# Patient Record
Sex: Female | Born: 1960 | Race: White | Hispanic: No | Marital: Married
Health system: Midwestern US, Academic
[De-identification: ages and names within clinical notes are randomized; demographics above are authoritative.]

---

## 2015-12-31 IMAGING — MG MM MAMMO DIGITAL SCREEN
6 series · 6 of 6 positions shown · non-contrast
Comparison: none

EXAM:  BILATERAL DIGITAL SCREENING MAMMOGRAM, 12/31/2015
REASON FOR PROCEDURE:  55-year-old outpatient female presents for an annual screening mammogram.  The patient has no family history of breast or ovarian cancer.  Patient has no personal history of cancers of any kind or breast surgery.  Patient has an extensive history of estrogen use, including hormonal birth control.  

R2 Computer-aided detection images were reviewed in the course of this evaluation.

[R CC]
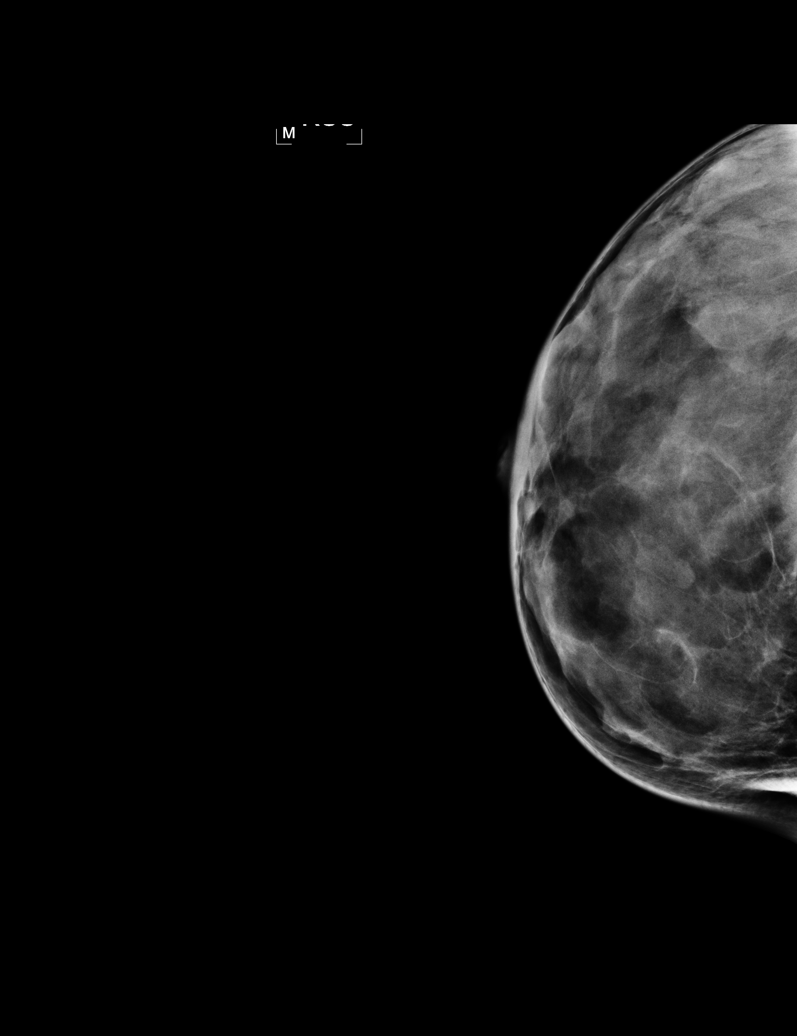

[L CC]
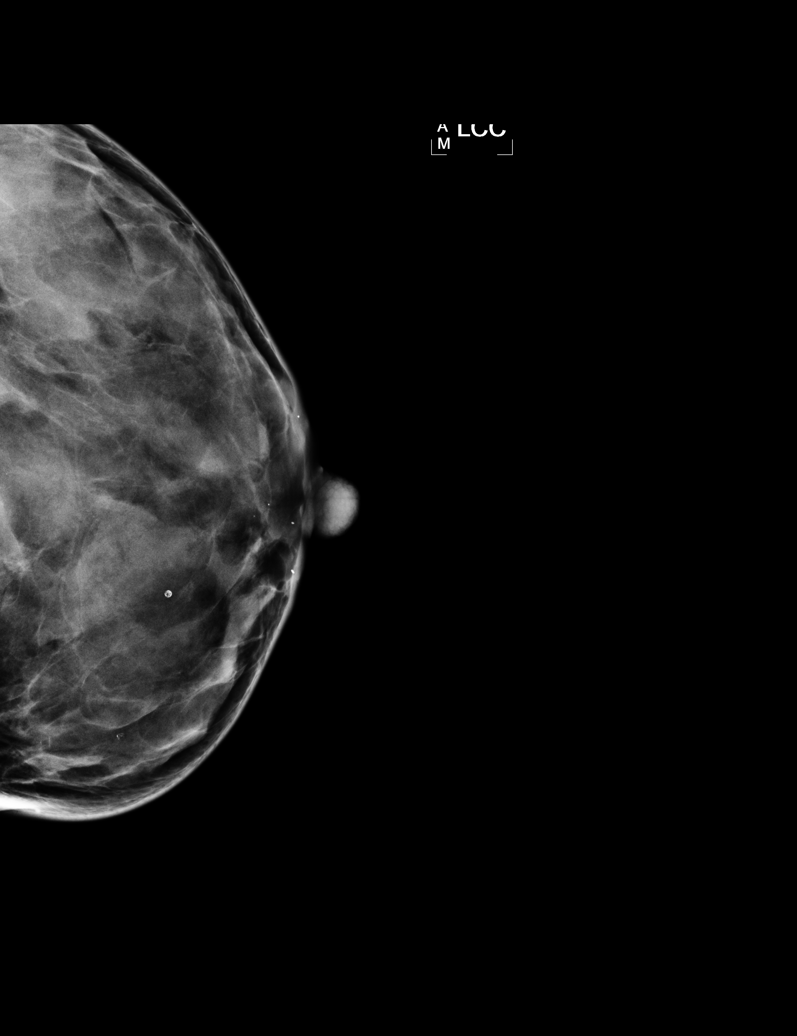

[L MLO (1 of 2)]
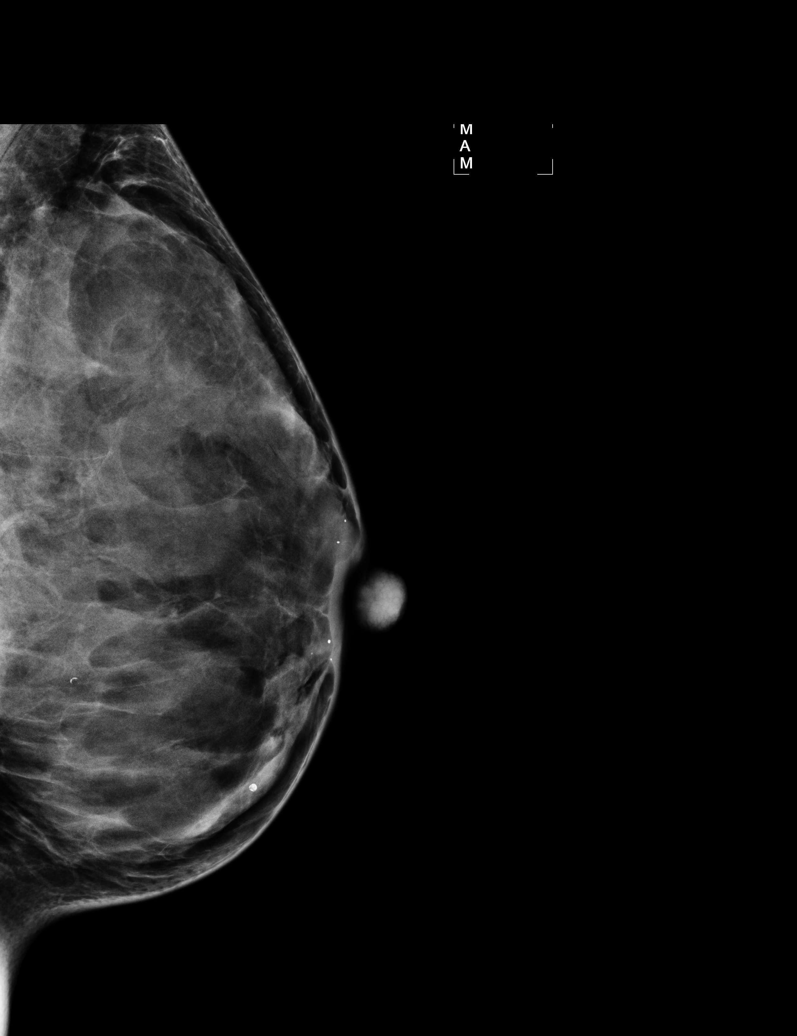

[R MLO (1 of 2)]
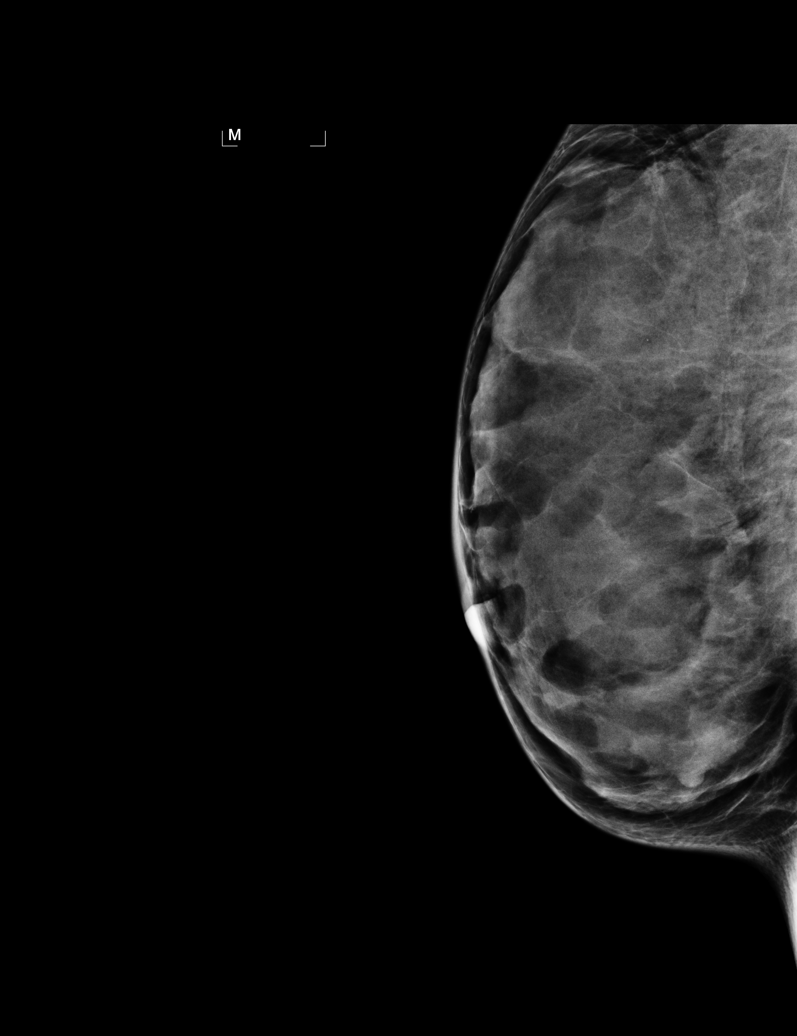

[L MLO (2 of 2)]
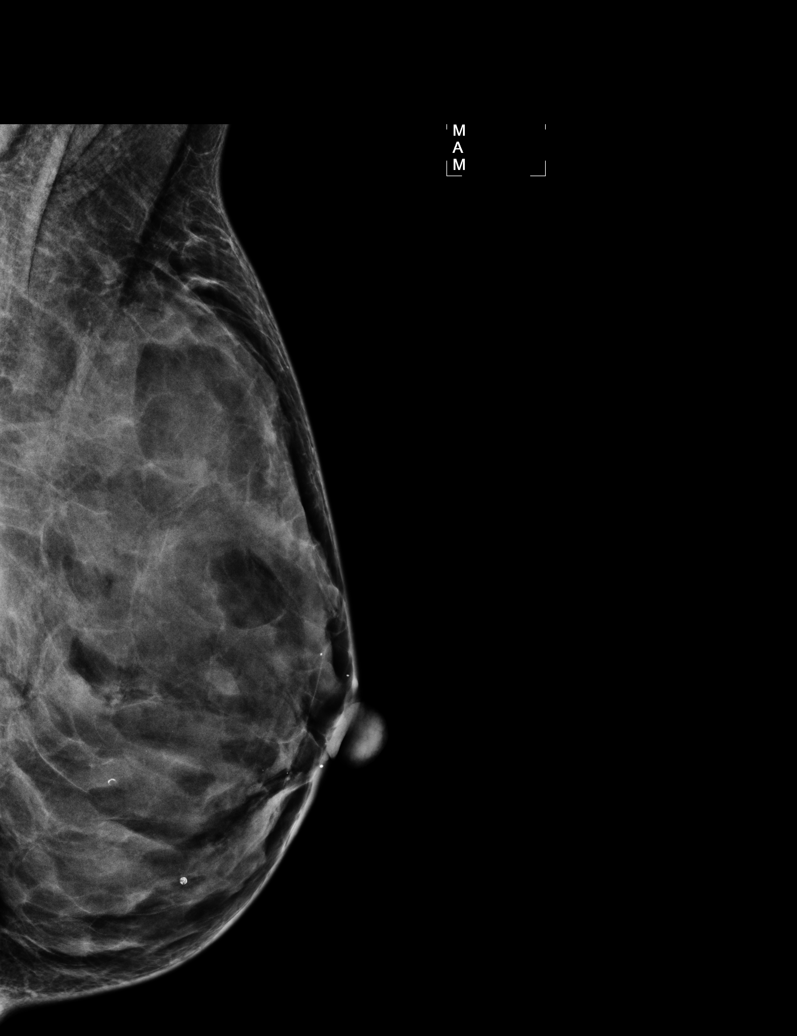

[R MLO (2 of 2)]
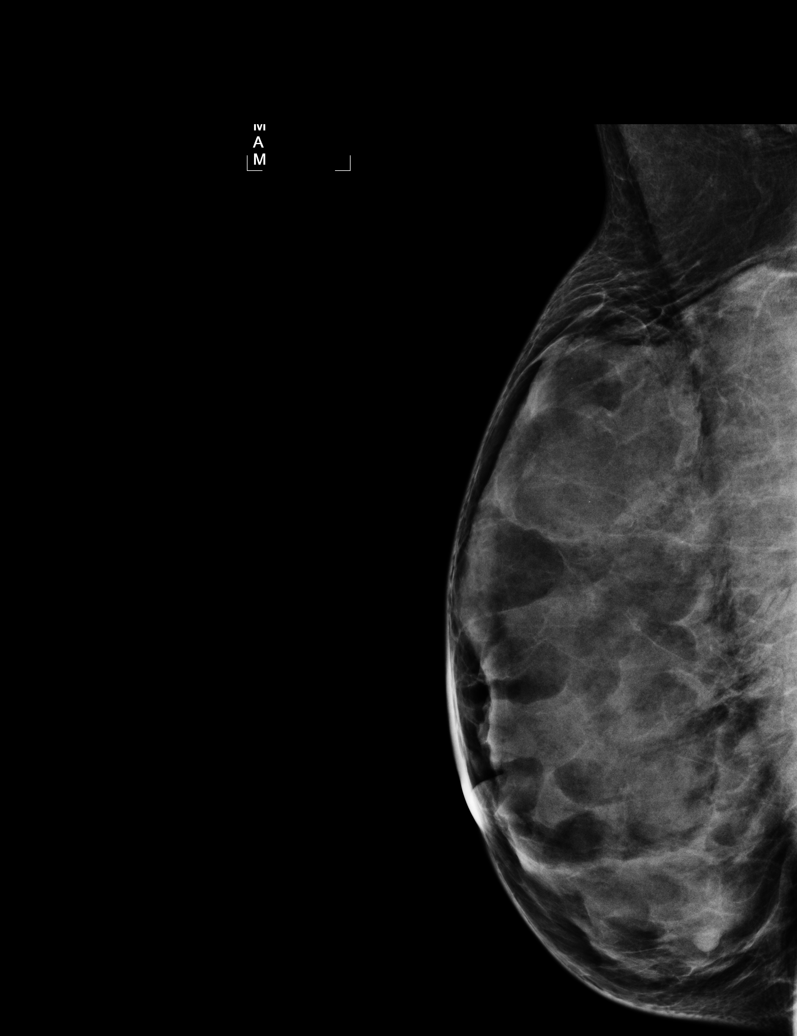

[6 of 6 positions shown; findings below may reference images not displayed]

FINDINGS: Digital CC and MLO views of both breasts are compared to a study performed 12/22/2014.  

BREAST COMPOSITION:  The breast parenchyma is type D.  These breasts are extremely dense.  

There are no suspicious masses, clusters of microcalcifications or skin changes in either breast.  
Benign microcalcifications are noted in the patient's left breast retroareolar region.
IMPRESSION: No suspicious findings to suggest malignancy.  I would recommend an annual screening study.  

ACR BIRADS CATEGORY – 2 – BENIGN

## 2016-05-12 MED ORDER — BACLOFEN 10 MG PO TAB
ORAL_TABLET | Freq: Three times a day (TID) | 1 refills | Status: DC
Start: 2016-05-12 — End: 2016-06-07

## 2016-06-07 MED ORDER — FLUOXETINE 20 MG PO CAP
20 mg | ORAL_CAPSULE | Freq: Every day | ORAL | 1 refills | Status: DC
Start: 2016-06-07 — End: 2016-11-11

## 2016-06-07 MED ORDER — BACLOFEN 10 MG PO TAB
10 mg | ORAL_TABLET | Freq: Three times a day (TID) | ORAL | 1 refills | Status: DC
Start: 2016-06-07 — End: 2017-01-09

## 2016-06-07 MED ORDER — GABAPENTIN 300 MG PO CAP
300 mg | ORAL_CAPSULE | Freq: Three times a day (TID) | ORAL | 1 refills | Status: DC
Start: 2016-06-07 — End: 2017-01-09

## 2016-06-07 MED ORDER — OXYBUTYNIN CHLORIDE 5 MG PO TAB
5 mg | ORAL_TABLET | Freq: Two times a day (BID) | ORAL | 1 refills | 30.00000 days | Status: DC
Start: 2016-06-07 — End: 2016-11-16

## 2016-07-28 ENCOUNTER — Encounter: Admit: 2016-07-28 | Discharge: 2016-07-28 | Payer: MEDICARE

## 2016-07-28 MED ORDER — NITROFURANTOIN MACROCRYSTAL 50 MG PO CAP
50 mg | ORAL_CAPSULE | Freq: Every evening | ORAL | 1 refills | 7.00000 days | Status: AC
Start: 2016-07-28 — End: 2017-01-09

## 2016-08-27 ENCOUNTER — Encounter: Admit: 2016-08-27 | Discharge: 2016-08-27 | Payer: MEDICARE

## 2016-08-29 MED ORDER — PROMETHAZINE 25 MG PO TAB
25 mg | ORAL_TABLET | ORAL | 3 refills | Status: SS | PRN
Start: 2016-08-29 — End: 2017-09-21

## 2016-10-02 ENCOUNTER — Encounter: Admit: 2016-10-02 | Discharge: 2016-10-02 | Payer: MEDICARE

## 2016-10-03 LAB — COMPREHENSIVE METABOLIC PANEL
Lab: 0.9
Lab: 0.9
Lab: 103
Lab: 103
Lab: 140
Lab: 140 K/UL (ref 0–0.45)
Lab: 25
Lab: 25
Lab: 29
Lab: 29
Lab: 3.8
Lab: 3.8
Lab: 32
Lab: 32
Lab: 36
Lab: 36
Lab: 4.6
Lab: 4.6 10*3/uL (ref 0–0.20)
Lab: 59
Lab: 59
Lab: 6.8
Lab: 6.8
Lab: 6.8
Lab: 69
Lab: 69
Lab: 9.7
Lab: 9.7
Lab: 91
Lab: 91

## 2016-10-03 LAB — CBC AND DIFF
Lab: 11 mL/min (ref >60)
Lab: 11 mg/dL — ABNORMAL HIGH (ref 7–25)
Lab: 2.6 MMOL/L — ABNORMAL LOW (ref 98–110)
Lab: 3.8 mg/dL — ABNORMAL HIGH (ref 70–100)
Lab: 36 mg/dL (ref 0.4–1.00)
Lab: 94 mg/dL — ABNORMAL HIGH (ref 8.5–10.6)

## 2016-10-03 LAB — 25-OH VITAMIN D (D2 + D3): Lab: 72 M/UL (ref 4.4–5.5)

## 2016-10-06 ENCOUNTER — Encounter: Admit: 2016-10-06 | Discharge: 2016-10-06 | Payer: MEDICARE

## 2016-10-06 ENCOUNTER — Ambulatory Visit: Admit: 2016-10-06 | Discharge: 2016-10-07 | Payer: BC Managed Care – PPO

## 2016-10-06 DIAGNOSIS — H269 Unspecified cataract: ICD-10-CM

## 2016-10-06 DIAGNOSIS — M26609 Unspecified temporomandibular joint disorder, unspecified side: Principal | ICD-10-CM

## 2016-10-06 DIAGNOSIS — Z79899 Other long term (current) drug therapy: ICD-10-CM

## 2016-10-06 DIAGNOSIS — G35 Multiple sclerosis: ICD-10-CM

## 2016-10-06 DIAGNOSIS — G4700 Insomnia, unspecified: ICD-10-CM

## 2016-10-06 DIAGNOSIS — N319 Neuromuscular dysfunction of bladder, unspecified: ICD-10-CM

## 2016-10-06 DIAGNOSIS — F329 Major depressive disorder, single episode, unspecified: ICD-10-CM

## 2016-10-06 NOTE — Assessment & Plan Note
She is stable at present.   Encouraged exercise  Continue betaseron 3/4 dose  Reduce vitamin D to 1000 IUs daily.

## 2016-10-06 NOTE — Progress Notes
Date of Service: 10/06/2016    Subjective:             Leslie Hawkins is a 56 y.o. female.    History of Present Illness  She is currently doing well- no changes since last visit. She Takes Betaseron faithfully and reports no problems with the medication. (3/4 dose). She is not exercising as much as she thinks she should- but a little more than 6 months ago. She has not had any major falls. She continues to use her cane outside the home, but not inside the home.   Balance remains her biggest problem      Review of Systems   Eyes: Positive for itching.   Musculoskeletal: Positive for back pain.   All other systems reviewed and are negative.        Objective:         ??? baclofen (LIORESAL) 10 mg tablet Take 1 tablet by mouth three times daily.   ??? BETASERON 0.3 mg kit Inject 1 ml under the skin every 48 hours   ??? brimonidine(+) (ALPHAGAN P) 0.1 % ophthalmic solution Apply 1 drop to both eyes twice daily.   ??? CALCIUM CARBONATE (CALCIUM 500 PO) Take 1 tablet by mouth daily.   ??? calcium carbonate (TUMS PO) Take 2 tablets by mouth daily. Extra Strength.   ??? Cholecalciferol (Vitamin D3) 2,000 unit cap Take 1 capsule by mouth daily.   ??? DOCOSAHEXANOIC ACID/EPA (FISH OIL PO) Take 2 capsules by mouth twice daily.   ??? fluoxetine (PROZAC) 20 mg capsule Take 1 capsule by mouth daily.   ??? gabapentin (NEURONTIN) 300 mg capsule Take 1 capsule by mouth three times daily.   ??? ibuprofen (MOTRIN) 400 mg tablet Take 400 mg by mouth twice daily. Take with food.   ??? lisinopril-hydrochlorothiazide (PRINZIDE, ZESTORETIC) 20-12.5 mg tablet Take 0.5 tablets by mouth daily.   ??? Miscellaneous Medical Supply misc Provide pt with padding adjustment with new velcro for R AFO brace.   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 Gara Hawkins, NPI: [id number]   ??? Miscellaneous Medical Supply misc Straight in/out catheters 14 French 8-10 times per day  Medical Necessity   Multiple Sclerosis   G35 ??? MULTIVITAMINS WITH FLUORIDE (MULTI-VITAMIN PO) Take 1 tablet by mouth daily.   ??? nitrofurantoin macrocrystal (MACRODANTIN) 50 mg capsule Take 1 capsule by mouth at bedtime daily.   ??? oxybutynin chloride (DITROPAN) 5 mg tablet Take 1 tablet by mouth twice daily.   ??? promethazine (PHENERGAN) 25 mg tablet Take one tablet by mouth every 6 hours as needed for Nausea.   ??? traMADol (ULTRAM) 50 mg tablet Take 50 mg by mouth as Needed for Pain.     Vitals:    10/06/16 1210   BP: 136/78   Pulse: 52   Resp: 18   Weight: 60.6 kg (133 lb 9.6 oz)   Height: 172.7 cm (68)     Body mass index is 20.31 kg/m???.     Physical Exam  Examination    Mental Status Exam: Patient is alert and oriented in all four spheres. There is normal short and long term memory. Language functions are normal both for comprehension and expression.  Speech and Language: mild dysarthria  HEENT: Normal.  Cranial Nerve Exam:   Cranial Nerve II Right Left   Visual Acuity 20/25 20/25   Pupil     Visual Fields     Fundoscopic     Color Vision  Cranial Nerves III-XII Right Left   III, IV, VI (EOM's) INO INO   V nl nl   VII nl nl   VIII nl nl   IX, X nl nl   XI nl nl   XII nl nl     Nystagmus: None    Motor:    R L   R L   Neck flexors    Hip flexors 5 5   Neck extensors    Hip abductors     Shoulder Abductors 5 5  Hip extensors 5 5   Elbow flexors 5 5  Hip adductors     Elbow extensors 5 5  Knee flexors 5 5   Wrist flexors 5 5  Knee extensors 5 5   Wrist extensors 5 5  Ankle dorsiflexors 5 5   Finger flexors 5 5  Ankle plantar flexors 5 5   Finger abductors 5 5  Ankle inversion      5 5  Ankle eversion         Toe flexors 5 5       Toe extensors 5 5     Bulk and Tone:    Upper Extremity R L   Atrophy No No   Increased Tone No No   Fasciculations No No     Lower Extremity R L   Atrophy No No   Increased Tone No No   Fasciculations No No       Cerebellar/Fine Motor R L   Finger nose finger Normal Normal   Heel to shin     Finger tapping     Foot tapping Rapid alternating movements Normal Normal     Gait: Wide based ataxic with uneven steps with cane.   Ambulation Index: 7.69 w/ cane  Romberg:     9 hole peg  Leslie Hawkins 28.29  Leslie Hawkins 24.82      Depression Screening was performed on Leslie Hawkins in clinic today. Based on the score of 0, no follow up action or recommendations are necessary at this time.       Assessment and Plan:    Problem   Ms (Multiple Sclerosis) (Hcc)    Multiple Sclerosis Subtype: Secondary progressive   Symptom onset: 1992  Date of Diagnosis: 1992  Prior Medication failures: Avonex, Copaxone, Imuran, NBI 5788  Current DMD: Betaseron   Last MRI 2008- not available here              MS (multiple sclerosis)  She is stable at present.   Encouraged exercise  Continue betaseron 3/4 dose  Reduce vitamin D to 1000 IUs daily.

## 2016-10-07 DIAGNOSIS — G35 Multiple sclerosis: Principal | ICD-10-CM

## 2016-10-11 ENCOUNTER — Encounter: Admit: 2016-10-11 | Discharge: 2016-10-11 | Payer: MEDICARE

## 2016-10-11 DIAGNOSIS — Z79899 Other long term (current) drug therapy: ICD-10-CM

## 2016-10-11 DIAGNOSIS — E559 Vitamin D deficiency, unspecified: ICD-10-CM

## 2016-10-11 DIAGNOSIS — G35 Multiple sclerosis: Principal | ICD-10-CM

## 2016-10-20 ENCOUNTER — Encounter: Admit: 2016-10-20 | Discharge: 2016-10-20 | Payer: MEDICARE

## 2016-10-20 DIAGNOSIS — G35 Multiple sclerosis: ICD-10-CM

## 2016-10-20 DIAGNOSIS — Z79899 Other long term (current) drug therapy: Principal | ICD-10-CM

## 2016-11-09 ENCOUNTER — Encounter: Admit: 2016-11-09 | Discharge: 2016-11-09 | Payer: MEDICARE

## 2016-11-09 MED ORDER — BETASERON 0.3 MG SC KIT
10 refills | Status: AC
Start: 2016-11-09 — End: 2017-10-10

## 2016-11-09 NOTE — Telephone Encounter
Refill request for Betaseron 0.3 mg kit #15 with 10 refills.  Per plan of care from last OV on 10/06/2016 patient is to continue on medication.  Patient has follow up scheduled for 04/06/2017.  Refill authorization sent back to pharmacy.

## 2016-11-11 ENCOUNTER — Encounter: Admit: 2016-11-11 | Discharge: 2016-11-11 | Payer: MEDICARE

## 2016-11-11 MED ORDER — FLUOXETINE 20 MG PO CAP
ORAL_CAPSULE | Freq: Every day | 1 refills | Status: AC
Start: 2016-11-11 — End: 2017-05-08

## 2016-11-11 NOTE — Telephone Encounter
Refill request for Fluoxetine.  Last office visit 10/06/2016.  Next office visit scheduled 04/06/2017. Patient has long term use of the medication.  Refill authorization sent back to pharmacy #90 with 1 refill.

## 2016-11-15 ENCOUNTER — Encounter: Admit: 2016-11-15 | Discharge: 2016-11-15 | Payer: MEDICARE

## 2016-11-16 MED ORDER — OXYBUTYNIN CHLORIDE 5 MG PO TAB
ORAL_TABLET | Freq: Two times a day (BID) | 1 refills | 30.00000 days | Status: AC
Start: 2016-11-16 — End: 2017-05-15

## 2016-11-16 NOTE — Telephone Encounter
Refill request for oxybutynin 5 mg 1 tab BID #180 with 1 refill.  Last office visit 10/06/16. Next office visit scheduled 04/06/17.  Last refill 06/07/16 #180 with 1 refill.  Patient has long term history of using this medication. Refill authorization sent back to pharmacy.

## 2016-11-22 ENCOUNTER — Encounter: Admit: 2016-11-22 | Discharge: 2016-11-22 | Payer: MEDICARE

## 2016-11-24 ENCOUNTER — Encounter: Admit: 2016-11-24 | Discharge: 2016-11-24 | Payer: MEDICARE

## 2016-12-12 ENCOUNTER — Encounter: Admit: 2016-12-12 | Discharge: 2016-12-12 | Payer: MEDICARE

## 2016-12-12 NOTE — Progress Notes
A prior authorization for Betaseron was previously submitted to the patients prescription insurance [employer]. The prior authorization PA # [id number]  was approved from 11-03-16 through 11-24-17 .  Approval letter faxed/ called to [place] at  [phone]    [facility], LPN

## 2016-12-22 ENCOUNTER — Encounter: Admit: 2016-12-22 | Discharge: 2016-12-22 | Payer: MEDICARE

## 2017-01-07 ENCOUNTER — Encounter: Admit: 2017-01-07 | Discharge: 2017-01-07 | Payer: MEDICARE

## 2017-01-09 MED ORDER — NITROFURANTOIN MACROCRYSTAL 50 MG PO CAP
50 mg | ORAL_CAPSULE | Freq: Every evening | ORAL | 1 refills | 7.00000 days | Status: AC
Start: 2017-01-09 — End: 2017-07-31

## 2017-01-09 MED ORDER — GABAPENTIN 300 MG PO CAP
300 mg | ORAL_CAPSULE | Freq: Three times a day (TID) | ORAL | 1 refills | Status: AC
Start: 2017-01-09 — End: 2017-07-25

## 2017-01-09 MED ORDER — BACLOFEN 10 MG PO TAB
10 mg | ORAL_TABLET | Freq: Three times a day (TID) | ORAL | 1 refills | Status: AC
Start: 2017-01-09 — End: 2017-07-25

## 2017-01-28 ENCOUNTER — Encounter: Admit: 2017-01-28 | Discharge: 2017-01-28 | Payer: MEDICARE

## 2017-03-14 LAB — COMPREHENSIVE METABOLIC PANEL
Lab: 139
Lab: 4.3 — ABNORMAL LOW (ref >60)

## 2017-03-14 LAB — CBC AND DIFF
Lab: 0
Lab: 0.1
Lab: 0.4
Lab: 0.4
Lab: 0.8
Lab: 1.5
Lab: 10 mL/min (ref >60)
Lab: 12 K/UL (ref 0–0.45)
Lab: 12 U/L (ref 25–110)
Lab: 15
Lab: 2.7 mg/dL — AB (ref 0.3–1.2)
Lab: 2.9
Lab: 222 mL/min (ref >60)
Lab: 27
Lab: 30 U/L (ref 7–56)
Lab: 33 K/UL (ref 3–12)
Lab: 36 U/L (ref 7–40)
Lab: 4 g/dL (ref 3.5–5.0)
Lab: 53 10*3/uL (ref 0–0.20)
Lab: 90 MMOL/L (ref 21–30)

## 2017-03-27 ENCOUNTER — Encounter: Admit: 2017-03-27 | Discharge: 2017-03-27 | Payer: MEDICARE

## 2017-03-27 DIAGNOSIS — G35 Multiple sclerosis: Principal | ICD-10-CM

## 2017-03-27 DIAGNOSIS — Z79899 Other long term (current) drug therapy: ICD-10-CM

## 2017-04-05 ENCOUNTER — Encounter: Admit: 2017-04-05 | Discharge: 2017-04-05 | Payer: MEDICARE

## 2017-04-05 ENCOUNTER — Ambulatory Visit: Admit: 2017-04-05 | Discharge: 2017-04-06 | Payer: BC Managed Care – PPO

## 2017-04-05 DIAGNOSIS — N319 Neuromuscular dysfunction of bladder, unspecified: ICD-10-CM

## 2017-04-05 DIAGNOSIS — G35 Multiple sclerosis: Principal | ICD-10-CM

## 2017-04-05 DIAGNOSIS — G4700 Insomnia, unspecified: ICD-10-CM

## 2017-04-05 DIAGNOSIS — F329 Major depressive disorder, single episode, unspecified: ICD-10-CM

## 2017-04-05 DIAGNOSIS — M26609 Unspecified temporomandibular joint disorder, unspecified side: Principal | ICD-10-CM

## 2017-04-05 DIAGNOSIS — H269 Unspecified cataract: ICD-10-CM

## 2017-05-07 ENCOUNTER — Encounter: Admit: 2017-05-07 | Discharge: 2017-05-07 | Payer: MEDICARE

## 2017-05-08 ENCOUNTER — Encounter: Admit: 2017-05-08 | Discharge: 2017-05-08 | Payer: MEDICARE

## 2017-05-08 MED ORDER — FLUOXETINE 20 MG PO CAP
20 mg | ORAL_CAPSULE | Freq: Every day | ORAL | 1 refills | Status: AC
Start: 2017-05-08 — End: 2017-10-30

## 2017-05-15 ENCOUNTER — Encounter: Admit: 2017-05-15 | Discharge: 2017-05-15 | Payer: MEDICARE

## 2017-05-15 MED ORDER — OXYBUTYNIN CHLORIDE 5 MG PO TAB
ORAL_TABLET | Freq: Two times a day (BID) | 1 refills | 30.00000 days | Status: AC
Start: 2017-05-15 — End: 2017-07-25

## 2017-06-09 ENCOUNTER — Encounter: Admit: 2017-06-09 | Discharge: 2017-06-09 | Payer: MEDICARE

## 2017-06-22 ENCOUNTER — Encounter: Admit: 2017-06-22 | Discharge: 2017-06-22 | Payer: MEDICARE

## 2017-07-16 ENCOUNTER — Encounter: Admit: 2017-07-16 | Discharge: 2017-07-16 | Payer: MEDICARE

## 2017-07-25 ENCOUNTER — Encounter: Admit: 2017-07-25 | Discharge: 2017-07-25 | Payer: MEDICARE

## 2017-07-25 MED ORDER — BACLOFEN 10 MG PO TAB
10 mg | ORAL_TABLET | Freq: Three times a day (TID) | ORAL | 1 refills | Status: AC
Start: 2017-07-25 — End: 2018-02-06

## 2017-07-25 MED ORDER — GABAPENTIN 300 MG PO CAP
300 mg | ORAL_CAPSULE | Freq: Three times a day (TID) | ORAL | 1 refills | Status: AC
Start: 2017-07-25 — End: 2018-02-06

## 2017-07-25 MED ORDER — OXYBUTYNIN CHLORIDE 5 MG PO TAB
5 mg | ORAL_TABLET | Freq: Two times a day (BID) | ORAL | 1 refills | 30.00000 days | Status: AC
Start: 2017-07-25 — End: 2017-10-03

## 2017-07-31 ENCOUNTER — Encounter: Admit: 2017-07-31 | Discharge: 2017-07-31 | Payer: MEDICARE

## 2017-07-31 MED ORDER — NITROFURANTOIN MACROCRYSTAL 50 MG PO CAP
50 mg | ORAL_CAPSULE | Freq: Every evening | ORAL | 1 refills | 7.00000 days | Status: AC
Start: 2017-07-31 — End: 2017-10-03

## 2017-09-17 ENCOUNTER — Encounter: Admit: 2017-09-17 | Discharge: 2017-09-17 | Payer: MEDICARE

## 2017-09-17 ENCOUNTER — Encounter: Admit: 2017-09-17 | Discharge: 2017-09-18 | Payer: MEDICARE

## 2017-09-17 DIAGNOSIS — K562 Volvulus: Principal | ICD-10-CM

## 2017-09-17 MED ORDER — SODIUM CHLORIDE 0.9 % IV SOLP
1000 mL | INTRAVENOUS | 0 refills | Status: DC
Start: 2017-09-17 — End: 2017-09-18

## 2017-09-17 MED ORDER — FENTANYL CITRATE (PF) 50 MCG/ML IJ SOLN
25-50 ug | INTRAVENOUS | 0 refills | Status: DC | PRN
Start: 2017-09-17 — End: 2017-09-18

## 2017-09-17 MED ORDER — ONDANSETRON HCL (PF) 4 MG/2 ML IJ SOLN
4 mg | Freq: Once | INTRAVENOUS | 0 refills | Status: CP
Start: 2017-09-17 — End: ?

## 2017-09-18 ENCOUNTER — Inpatient Hospital Stay
Admit: 2017-09-18 | Discharge: 2017-09-25 | Disposition: A | Discharge status: Rehabilitation Facility | Payer: BC Managed Care – PPO | Source: Other Acute Inpatient Hospital | Admitting: Critical Care Medicine

## 2017-09-18 ENCOUNTER — Encounter: Admit: 2017-09-18 | Discharge: 2017-09-18 | Payer: MEDICARE

## 2017-09-18 DIAGNOSIS — F329 Major depressive disorder, single episode, unspecified: ICD-10-CM

## 2017-09-18 DIAGNOSIS — N319 Neuromuscular dysfunction of bladder, unspecified: ICD-10-CM

## 2017-09-18 DIAGNOSIS — G35 Multiple sclerosis: ICD-10-CM

## 2017-09-18 DIAGNOSIS — H269 Unspecified cataract: ICD-10-CM

## 2017-09-18 DIAGNOSIS — D62 Acute posthemorrhagic anemia: Secondary | ICD-10-CM

## 2017-09-18 DIAGNOSIS — M26609 Unspecified temporomandibular joint disorder, unspecified side: Principal | ICD-10-CM

## 2017-09-18 DIAGNOSIS — G4700 Insomnia, unspecified: ICD-10-CM

## 2017-09-18 DIAGNOSIS — E876 Hypokalemia: ICD-10-CM

## 2017-09-18 LAB — CBC
Lab: 11 g/dL — ABNORMAL LOW (ref 12.0–15.0)
Lab: 13 % (ref 11–15)
Lab: 239 10*3/uL (ref 150–400)
Lab: 29 pg (ref 26–34)
Lab: 3.9 M/UL — ABNORMAL LOW (ref 4.0–5.0)
Lab: 33 g/dL (ref 32.0–36.0)
Lab: 34 % — ABNORMAL LOW (ref 36–45)
Lab: 8.8 10*3/uL (ref 4.5–11.0)
Lab: 8.9 FL (ref 7–11)
Lab: 88 FL (ref 80–100)
Lab: 14 % — ABNORMAL LOW (ref 11–15)
Lab: 174 10*3/uL — ABNORMAL LOW (ref >60)
Lab: 27 pg — ABNORMAL HIGH (ref 26–34)
Lab: 31 g/dL — ABNORMAL LOW (ref 32.0–36.0)
Lab: 5.2 K/UL — ABNORMAL HIGH (ref 4.5–11.0)
Lab: 8.3 FL (ref >60)

## 2017-09-18 LAB — BASIC METABOLIC PANEL
Lab: 1.2 mg/dL — ABNORMAL HIGH (ref 0.4–1.00)
Lab: 10 mg/dL (ref 8.5–10.6)
Lab: 138 MMOL/L (ref 137–147)
Lab: 194 mg/dL — ABNORMAL HIGH (ref 70–100)
Lab: 22 MMOL/L (ref 21–30)
Lab: 4.6 MMOL/L (ref 3.5–5.1)
Lab: 44 mL/min — ABNORMAL LOW (ref >60)
Lab: 44 mg/dL — ABNORMAL HIGH (ref 7–25)
Lab: 53 mL/min — ABNORMAL LOW (ref >60)
Lab: 10 (ref 3–12)
Lab: 142 MMOL/L — ABNORMAL HIGH (ref 137–147)

## 2017-09-18 LAB — BLOOD GASES, ARTERIAL
Lab: 44 mmHg — ABNORMAL HIGH (ref 35–45)
Lab: 7.3 mmHg — ABNORMAL LOW (ref 7.35–7.45)
Lab: 7.3 — ABNORMAL LOW (ref 7.35–7.45)

## 2017-09-18 LAB — IONIZED CALCIUM: Lab: 1.2 MMOL/L — ABNORMAL LOW (ref 1.0–1.3)

## 2017-09-18 LAB — POC GLUCOSE
Lab: 114 mg/dL — ABNORMAL HIGH (ref 70–100)
Lab: 108 mg/dL — ABNORMAL HIGH (ref 70–100)

## 2017-09-18 LAB — MAGNESIUM
Lab: 1.9 mg/dL (ref 1.6–2.6)
Lab: 1.3 mg/dL — ABNORMAL LOW (ref 1.6–2.6)

## 2017-09-18 LAB — LACTIC ACID(LACTATE)
Lab: 2.5 MMOL/L — ABNORMAL HIGH (ref 0.5–2.0)
Lab: 1.5 MMOL/L — ABNORMAL LOW (ref 0.5–2.0)

## 2017-09-18 LAB — PHOSPHORUS
Lab: 4.4 mg/dL (ref 2.0–4.5)
Lab: 3.2 mg/dL — ABNORMAL HIGH (ref 2.0–4.5)

## 2017-09-18 MED ORDER — CHLORHEXIDINE GLUCONATE 0.12 % MM MWSH
15 mL | Freq: Two times a day (BID) | 0 refills | Status: DC
Start: 2017-09-18 — End: 2017-09-18
  Administered 2017-09-18: 13:00:00 15 mL

## 2017-09-18 MED ORDER — PROPOFOL INJ 10 MG/ML IV VIAL
0 refills | Status: DC
Start: 2017-09-18 — End: 2017-09-18
  Administered 2017-09-18: 06:00:00 120 mg via INTRAVENOUS
  Administered 2017-09-18 (×2): 40 mg via INTRAVENOUS

## 2017-09-18 MED ORDER — METOCLOPRAMIDE HCL 5 MG/ML IJ SOLN
10 mg | Freq: Once | INTRAVENOUS | 0 refills | Status: DC | PRN
Start: 2017-09-18 — End: 2017-09-18

## 2017-09-18 MED ORDER — ROCURONIUM 10 MG/ML IV SOLN
INTRAVENOUS | 0 refills | Status: DC
Start: 2017-09-18 — End: 2017-09-18
  Administered 2017-09-18: 06:00:00 30 mg via INTRAVENOUS

## 2017-09-18 MED ORDER — SUCCINYLCHOLINE CHLORIDE 20 MG/ML IJ SOLN
INTRAVENOUS | 0 refills | Status: DC
Start: 2017-09-18 — End: 2017-09-18
  Administered 2017-09-18: 06:00:00 100 mg via INTRAVENOUS

## 2017-09-18 MED ORDER — ACETAMINOPHEN 650 MG RE SUPP
650 mg | RECTAL | 0 refills | Status: DC | PRN
Start: 2017-09-18 — End: 2017-09-20

## 2017-09-18 MED ORDER — ALBUMIN, HUMAN 5 % 500 ML IV SOLP (AN)(OSM)
0 refills | Status: DC
Start: 2017-09-18 — End: 2017-09-18
  Administered 2017-09-18: 07:00:00 via INTRAVENOUS

## 2017-09-18 MED ORDER — PROPOFOL 10 MG/ML IV EMUL
5-75 ug/kg/min | INTRAVENOUS | 0 refills | Status: DC
Start: 2017-09-18 — End: 2017-09-18
  Administered 2017-09-18: 08:00:00 50 ug/kg/min via INTRAVENOUS
  Administered 2017-09-18: 14:00:00 25 ug/kg/min via INTRAVENOUS

## 2017-09-18 MED ORDER — HYDRALAZINE 20 MG/ML IJ SOLN
10 mg | INTRAVENOUS | 0 refills | Status: DC | PRN
Start: 2017-09-18 — End: 2017-09-20
  Administered 2017-09-19 (×2): 10 mg via INTRAVENOUS

## 2017-09-18 MED ORDER — LABETALOL 5 MG/ML IV SYRG
10 mg | Freq: Once | INTRAVENOUS | 0 refills | Status: CP
Start: 2017-09-18 — End: ?

## 2017-09-18 MED ORDER — MIDAZOLAM 1 MG/ML IJ SOLN
2 mg | Freq: Once | INTRAVENOUS | 0 refills | Status: CP
Start: 2017-09-18 — End: ?
  Administered 2017-09-18: 06:00:00 2 mg via INTRAVENOUS

## 2017-09-18 MED ORDER — HYDROMORPHONE (PF) 2 MG/ML IJ SYRG
1 mg | INTRAVENOUS | 0 refills | Status: DC | PRN
Start: 2017-09-18 — End: 2017-09-18

## 2017-09-18 MED ORDER — MIDAZOLAM 1 MG/ML IJ SOLN
0 refills | Status: DC
Start: 2017-09-18 — End: 2017-09-18
  Administered 2017-09-18: 07:00:00 2 mg via INTRAVENOUS

## 2017-09-18 MED ORDER — MIDAZOLAM 1 MG/ML IJ SOLN
1-2 mg | Freq: Once | INTRAVENOUS | 0 refills | 6.00000 days | Status: AC
Start: 2017-09-18 — End: 2017-09-25

## 2017-09-18 MED ORDER — MEPERIDINE (PF) 25 MG/ML IJ SYRG
12.5 mg | INTRAVENOUS | 0 refills | Status: DC | PRN
Start: 2017-09-18 — End: 2017-09-18

## 2017-09-18 MED ORDER — EPHEDRINE SULFATE 50 MG/5ML SYR (10 MG/ML) (AN)(OSM)
0 refills | Status: DC
Start: 2017-09-18 — End: 2017-09-18
  Administered 2017-09-18 (×2): 5 mg via INTRAVENOUS

## 2017-09-18 MED ORDER — ONDANSETRON HCL (PF) 4 MG/2 ML IJ SOLN
INTRAVENOUS | 0 refills | Status: DC
Start: 2017-09-18 — End: 2017-09-18
  Administered 2017-09-18: 07:00:00 4 mg via INTRAVENOUS

## 2017-09-18 MED ORDER — NALOXONE 0.4 MG/ML IJ SOLN
.08 mg | INTRAVENOUS | 0 refills | Status: DC | PRN
Start: 2017-09-18 — End: 2017-09-18

## 2017-09-18 MED ORDER — ONDANSETRON HCL (PF) 4 MG/2 ML IJ SOLN
4-8 mg | INTRAVENOUS | 0 refills | Status: DC | PRN
Start: 2017-09-18 — End: 2017-09-25
  Administered 2017-09-19 – 2017-09-23 (×5): 4 mg via INTRAVENOUS

## 2017-09-18 MED ORDER — LABETALOL 5 MG/ML IV SYRG
10 mg | INTRAVENOUS | 0 refills | Status: DC | PRN
Start: 2017-09-18 — End: 2017-09-19
  Administered 2017-09-18: 23:00:00 10 mg via INTRAVENOUS

## 2017-09-18 MED ORDER — HALOPERIDOL LACTATE 5 MG/ML IJ SOLN
1 mg | Freq: Once | INTRAVENOUS | 0 refills | Status: DC | PRN
Start: 2017-09-18 — End: 2017-09-18

## 2017-09-18 MED ORDER — FENTANYL PCA 550 MCG/55 ML SYR (STD CONC)(ADULT)(PREMADE)
INTRAVENOUS | 0 refills | Status: DC
Start: 2017-09-18 — End: 2017-09-18

## 2017-09-18 MED ORDER — VASOPRESSIN 20 UNIT/ML IV SOLN
0 refills | Status: DC
Start: 2017-09-18 — End: 2017-09-18
  Administered 2017-09-18: 07:00:00 1 [IU] via INTRAVENOUS

## 2017-09-18 MED ORDER — PHENYLEPHRINE IV DRIP (STD CONC)
0 refills | Status: DC
Start: 2017-09-18 — End: 2017-09-18
  Administered 2017-09-18 (×2): .6 ug/kg/min via INTRAVENOUS

## 2017-09-18 MED ORDER — FENTANYL CITRATE (PF) 50 MCG/ML IJ SOLN
0 refills | Status: DC
Start: 2017-09-18 — End: 2017-09-18
  Administered 2017-09-18 (×2): 50 ug via INTRAVENOUS

## 2017-09-18 MED ORDER — DEXTRAN 70-HYPROMELLOSE (PF) 0.1-0.3 % OP DPET
0 refills | Status: DC
Start: 2017-09-18 — End: 2017-09-18
  Administered 2017-09-18: 06:00:00 2 [drp] via OPHTHALMIC

## 2017-09-18 MED ORDER — FENTANYL CITRATE (PF) 50 MCG/ML IJ SOLN
25 ug | INTRAVENOUS | 0 refills | Status: DC | PRN
Start: 2017-09-18 — End: 2017-09-18

## 2017-09-18 MED ORDER — HYOSCYAMINE SULFATE 0.125 MG PO TBDI
.125 mg | SUBLINGUAL | 0 refills | Status: DC | PRN
Start: 2017-09-18 — End: 2017-09-22
  Administered 2017-09-19 (×2): 0.125 mg via SUBLINGUAL

## 2017-09-18 MED ORDER — PROCHLORPERAZINE EDISYLATE 5 MG/ML IJ SOLN
10 mg | INTRAVENOUS | 0 refills | Status: DC | PRN
Start: 2017-09-18 — End: 2017-09-20

## 2017-09-18 MED ORDER — LIDOCAINE (PF) 200 MG/10 ML (2 %) IJ SYRG
0 refills | Status: DC
Start: 2017-09-18 — End: 2017-09-18
  Administered 2017-09-18: 06:00:00 100 mg via INTRAVENOUS

## 2017-09-18 MED ORDER — LACTATED RINGERS IV SOLP
1000 mL | INTRAVENOUS | 0 refills | Status: CP
Start: 2017-09-18 — End: ?
  Administered 2017-09-18: 10:00:00 1000 mL via INTRAVENOUS

## 2017-09-18 MED ORDER — FENTANYL CITRATE (PF) 50 MCG/ML IJ SOLN
50 ug | INTRAVENOUS | 0 refills | Status: DC | PRN
Start: 2017-09-18 — End: 2017-09-18

## 2017-09-18 MED ORDER — MAGNESIUM SULFATE IN WATER 4 GRAM/50 ML (8 %) IV PGBK
4 g | Freq: Once | INTRAVENOUS | 0 refills | Status: CP
Start: 2017-09-18 — End: ?
  Administered 2017-09-18: 10:00:00 4 g via INTRAVENOUS

## 2017-09-18 MED ORDER — CEFOXITIN IVPB
2 g | INTRAVENOUS | 0 refills | Status: DC
Start: 2017-09-18 — End: 2017-09-18

## 2017-09-18 MED ORDER — PHENYLEPHRINE IN 0.9% NACL(PF) 1 MG/10 ML (100 MCG/ML) IV SYRG
INTRAVENOUS | 0 refills | Status: DC
Start: 2017-09-18 — End: 2017-09-18
  Administered 2017-09-18: 06:00:00 100 ug via INTRAVENOUS

## 2017-09-18 MED ORDER — FAMOTIDINE (PF) 20 MG/2 ML IV SOLN
20 mg | Freq: Two times a day (BID) | INTRAVENOUS | 0 refills | Status: DC
Start: 2017-09-18 — End: 2017-09-18
  Administered 2017-09-18: 13:00:00 20 mg via INTRAVENOUS

## 2017-09-18 MED ORDER — ENOXAPARIN 40 MG/0.4 ML SC SYRG
40 mg | Freq: Every day | SUBCUTANEOUS | 0 refills | Status: DC
Start: 2017-09-18 — End: 2017-09-25
  Administered 2017-09-20 – 2017-09-25 (×6): 40 mg via SUBCUTANEOUS

## 2017-09-18 MED ORDER — FENTANYL CITRATE (PF) 50 MCG/ML IJ SOLN
25-50 ug | INTRAVENOUS | 0 refills | Status: DC | PRN
Start: 2017-09-18 — End: 2017-09-20
  Administered 2017-09-18 – 2017-09-19 (×3): 50 ug via INTRAVENOUS
  Administered 2017-09-19: 01:00:00 25 ug via INTRAVENOUS

## 2017-09-18 MED ORDER — SUGAMMADEX 100 MG/ML IV SOLN
INTRAVENOUS | 0 refills | Status: DC
Start: 2017-09-18 — End: 2017-09-18
  Administered 2017-09-18: 08:00:00 123 mg via INTRAVENOUS

## 2017-09-18 MED ORDER — DEXTROSE 5%-0.45% SODIUM CHLORIDE & POTASSIUM CHLORIDE 20 MEQ/L IV SOLP
INTRAVENOUS | 0 refills | Status: DC
Start: 2017-09-18 — End: 2017-09-19
  Administered 2017-09-18: 19:00:00 1000.000 mL via INTRAVENOUS

## 2017-09-18 MED ORDER — NOREPINEPHRINE IV DRIP (STD CONC)
0 refills | Status: DC
Start: 2017-09-18 — End: 2017-09-18
  Administered 2017-09-18 (×2): .03 ug/kg/min via INTRAVENOUS

## 2017-09-18 MED ORDER — CEFOXITIN 2G/100ML NS IVPB (MB+) (OR ONLY)
0 refills | Status: DC
Start: 2017-09-18 — End: 2017-09-18
  Administered 2017-09-18 (×2): 2 g via INTRAVENOUS

## 2017-09-18 MED ORDER — LACTATED RINGERS IV SOLP
0 refills | Status: DC
Start: 2017-09-18 — End: 2017-09-18
  Administered 2017-09-18 (×2): via INTRAVENOUS

## 2017-09-18 MED ORDER — DEXAMETHASONE SODIUM PHOSPHATE 4 MG/ML IJ SOLN
INTRAVENOUS | 0 refills | Status: DC
Start: 2017-09-18 — End: 2017-09-18
  Administered 2017-09-18: 06:00:00 4 mg via INTRAVENOUS

## 2017-09-18 MED ORDER — PROMETHAZINE 25 MG/ML IJ SOLN
6.25 mg | INTRAVENOUS | 0 refills | Status: DC | PRN
Start: 2017-09-18 — End: 2017-09-18

## 2017-09-18 MED ORDER — FENTANYL PCA/DRIP IN NS 1000MCG/100ML
10-100 ug/h | INTRAVENOUS | 0 refills | Status: DC
Start: 2017-09-18 — End: 2017-09-18
  Administered 2017-09-18: 08:00:00 25 ug/h via INTRAVENOUS

## 2017-09-18 MED ORDER — INSULIN ASPART 100 UNIT/ML SC FLEXPEN
0-7 [IU] | Freq: Every day | SUBCUTANEOUS | 0 refills | Status: DC
Start: 2017-09-18 — End: 2017-09-20

## 2017-09-18 MED ORDER — CEFOXITIN IVPB
2 g | INTRAVENOUS | 0 refills | Status: CP
Start: 2017-09-18 — End: ?
  Administered 2017-09-18 – 2017-09-19 (×6): 2 g via INTRAVENOUS

## 2017-09-18 MED ADMIN — SODIUM CHLORIDE 0.9 % IV SOLP [27838]: 1000.000 mL | INTRAVENOUS | @ 06:00:00 | Stop: 2017-09-18 | NDC 00338004904

## 2017-09-18 MED ADMIN — LABETALOL 5 MG/ML IV SYRG [86579]: 10 mg | INTRAVENOUS | @ 08:00:00 | Stop: 2017-09-18 | NDC 69374094604

## 2017-09-18 MED ADMIN — SODIUM CHLORIDE 0.9 % IV SOLP [27838]: 1000 mL | INTRAVENOUS | @ 05:00:00 | Stop: 2017-09-18 | NDC 00338004904

## 2017-09-18 MED ADMIN — ONDANSETRON HCL (PF) 4 MG/2 ML IJ SOLN [136012]: 4 mg | INTRAVENOUS | @ 05:00:00 | Stop: 2017-09-18 | NDC 00641607801

## 2017-09-19 ENCOUNTER — Encounter: Admit: 2017-09-19 | Discharge: 2017-09-19 | Payer: MEDICARE

## 2017-09-19 DIAGNOSIS — H269 Unspecified cataract: ICD-10-CM

## 2017-09-19 DIAGNOSIS — M26609 Unspecified temporomandibular joint disorder, unspecified side: Principal | ICD-10-CM

## 2017-09-19 DIAGNOSIS — N319 Neuromuscular dysfunction of bladder, unspecified: ICD-10-CM

## 2017-09-19 DIAGNOSIS — F329 Major depressive disorder, single episode, unspecified: ICD-10-CM

## 2017-09-19 DIAGNOSIS — G4700 Insomnia, unspecified: ICD-10-CM

## 2017-09-19 DIAGNOSIS — G35 Multiple sclerosis: ICD-10-CM

## 2017-09-19 LAB — BASIC METABOLIC PANEL
Lab: 137 MMOL/L — ABNORMAL LOW (ref 137–147)
Lab: 4.8 MMOL/L — ABNORMAL LOW (ref 3.5–5.1)

## 2017-09-19 LAB — CBC
Lab: 3.4 M/UL — ABNORMAL LOW (ref 4.0–5.0)
Lab: 7.2 K/UL — ABNORMAL LOW (ref >60)

## 2017-09-19 LAB — MAGNESIUM: Lab: 1.9 mg/dL — ABNORMAL HIGH (ref 1.6–2.6)

## 2017-09-19 LAB — IONIZED CALCIUM: Lab: 1.2 MMOL/L — ABNORMAL LOW (ref 1.0–1.3)

## 2017-09-19 LAB — POC GLUCOSE
Lab: 121 mg/dL — ABNORMAL HIGH (ref 70–100)
Lab: 118 mg/dL — ABNORMAL HIGH (ref 70–100)
Lab: 121 mg/dL — ABNORMAL HIGH (ref 70–100)

## 2017-09-19 LAB — PHOSPHORUS: Lab: 2.8 mg/dL — ABNORMAL LOW (ref >60)

## 2017-09-19 MED ORDER — LACTATED RINGERS IV SOLP
0 refills | Status: DC
Start: 2017-09-19 — End: 2017-09-19
  Administered 2017-09-19: 16:00:00 via INTRAVENOUS

## 2017-09-19 MED ORDER — FENTANYL CITRATE (PF) 50 MCG/ML IJ SOLN
0 refills | Status: DC
Start: 2017-09-19 — End: 2017-09-19
  Administered 2017-09-19: 16:00:00 100 ug via INTRAVENOUS
  Administered 2017-09-19: 17:00:00 25 ug via INTRAVENOUS

## 2017-09-19 MED ORDER — ALBUMIN, HUMAN 5 % 500 ML IV SOLP (AN)(OSM)
0 refills | Status: DC
Start: 2017-09-19 — End: 2017-09-19
  Administered 2017-09-19: 16:00:00 via INTRAVENOUS

## 2017-09-19 MED ORDER — ONDANSETRON HCL (PF) 4 MG/2 ML IJ SOLN
INTRAVENOUS | 0 refills | Status: DC
Start: 2017-09-19 — End: 2017-09-19
  Administered 2017-09-19: 17:00:00 4 mg via INTRAVENOUS

## 2017-09-19 MED ORDER — DEXTROSE 5%-0.45% SODIUM CHLORIDE IV SOLP
INTRAVENOUS | 0 refills | Status: DC
Start: 2017-09-19 — End: 2017-09-20
  Administered 2017-09-19 – 2017-09-20 (×3): 1000.000 mL via INTRAVENOUS

## 2017-09-19 MED ORDER — PHENYLEPHRINE IV DRIP (DBL CONC)
0 refills | Status: DC
Start: 2017-09-19 — End: 2017-09-19
  Administered 2017-09-19 (×2): 0.5 ug/kg/min via INTRAVENOUS

## 2017-09-19 MED ORDER — PHENYLEPHRINE IN 0.9% NACL(PF) 1 MG/10 ML (100 MCG/ML) IV SYRG
INTRAVENOUS | 0 refills | Status: DC
Start: 2017-09-19 — End: 2017-09-19
  Administered 2017-09-19 (×3): 200 ug via INTRAVENOUS

## 2017-09-19 MED ORDER — ROCURONIUM 10 MG/ML IV SOLN
INTRAVENOUS | 0 refills | Status: DC
Start: 2017-09-19 — End: 2017-09-19
  Administered 2017-09-19: 16:00:00 25 mg via INTRAVENOUS
  Administered 2017-09-19: 17:00:00 10 mg via INTRAVENOUS

## 2017-09-19 MED ORDER — DEXTRAN 70-HYPROMELLOSE (PF) 0.1-0.3 % OP DPET
0 refills | Status: DC
Start: 2017-09-19 — End: 2017-09-19
  Administered 2017-09-19: 16:00:00 2 [drp] via OPHTHALMIC

## 2017-09-19 MED ORDER — BISACODYL 10 MG RE SUPP
20 mg | Freq: Once | RECTAL | 0 refills | Status: CP
Start: 2017-09-19 — End: ?
  Administered 2017-09-20: 02:00:00 20 mg via RECTAL

## 2017-09-19 MED ORDER — LIDOCAINE (PF) 200 MG/10 ML (2 %) IJ SYRG
0 refills | Status: DC
Start: 2017-09-19 — End: 2017-09-19
  Administered 2017-09-19: 16:00:00 100 mg via INTRAVENOUS

## 2017-09-19 MED ORDER — PROPOFOL INJ 10 MG/ML IV VIAL
0 refills | Status: DC
Start: 2017-09-19 — End: 2017-09-19
  Administered 2017-09-19: 16:00:00 20 mg via INTRAVENOUS
  Administered 2017-09-19: 16:00:00 80 mg via INTRAVENOUS

## 2017-09-19 MED ORDER — SUCCINYLCHOLINE CHLORIDE 20 MG/ML IJ SOLN
INTRAVENOUS | 0 refills | Status: DC
Start: 2017-09-19 — End: 2017-09-19
  Administered 2017-09-19: 16:00:00 120 mg via INTRAVENOUS

## 2017-09-19 MED ORDER — SUGAMMADEX 100 MG/ML IV SOLN
INTRAVENOUS | 0 refills | Status: DC
Start: 2017-09-19 — End: 2017-09-19
  Administered 2017-09-19 (×2): 120 mg via INTRAVENOUS

## 2017-09-19 MED ORDER — EPHEDRINE SULFATE 50 MG/5ML SYR (10 MG/ML) (AN)(OSM)
0 refills | Status: DC
Start: 2017-09-19 — End: 2017-09-19
  Administered 2017-09-19 (×2): 10 mg via INTRAVENOUS

## 2017-09-19 MED ADMIN — PROCHLORPERAZINE EDISYLATE 5 MG/ML IJ SOLN [6580]: 10 mg | INTRAVENOUS | @ 02:00:00 | Stop: 2017-09-19 | NDC 23155029431

## 2017-09-19 MED ADMIN — ONDANSETRON HCL (PF) 4 MG/2 ML IJ SOLN [136012]: 4 mg | INTRAVENOUS | @ 01:00:00 | Stop: 2017-09-19 | NDC 00641607801

## 2017-09-20 LAB — BASIC METABOLIC PANEL
Lab: 136 MMOL/L — ABNORMAL LOW (ref >60)
Lab: 1.2 mg/dL — ABNORMAL HIGH (ref 0.4–1.00)
Lab: 105 MMOL/L (ref 98–110)
Lab: 136 mg/dL — ABNORMAL HIGH (ref 70–100)
Lab: 138 MMOL/L (ref 137–147)
Lab: 27 MMOL/L (ref 21–30)
Lab: 38 mg/dL — ABNORMAL HIGH (ref 7–25)
Lab: 4.9 MMOL/L (ref 3.5–5.1)
Lab: 45 mL/min — ABNORMAL LOW (ref >60)
Lab: 6 (ref 3–12)
Lab: 106 MMOL/L (ref 98–110)
Lab: 135 MMOL/L — ABNORMAL LOW (ref 137–147)
Lab: 4.2 MMOL/L (ref 3.5–5.1)
Lab: 5 (ref 3–12)

## 2017-09-20 LAB — POC GLUCOSE
Lab: 123 mg/dL — ABNORMAL HIGH (ref 70–100)
Lab: 137 mg/dL — ABNORMAL HIGH (ref 70–100)
Lab: 119 mg/dL — ABNORMAL HIGH (ref 70–100)

## 2017-09-20 LAB — CBC: Lab: 3.7 K/UL — ABNORMAL LOW (ref 4.5–11.0)

## 2017-09-20 LAB — PHOSPHORUS: Lab: 1.9 mg/dL — ABNORMAL LOW (ref >60)

## 2017-09-20 LAB — MAGNESIUM: Lab: 1.2 mg/dL — ABNORMAL LOW (ref 1.6–2.6)

## 2017-09-20 MED ORDER — MELATONIN 3 MG PO TAB
3 mg | Freq: Every evening | ORAL | 0 refills | Status: DC | PRN
Start: 2017-09-20 — End: 2017-09-25
  Administered 2017-09-21 – 2017-09-25 (×3): 3 mg via ORAL

## 2017-09-20 MED ORDER — FLUOXETINE 20 MG PO CAP
20 mg | Freq: Every day | ORAL | 0 refills | Status: DC
Start: 2017-09-20 — End: 2017-09-25
  Administered 2017-09-20 – 2017-09-25 (×5): 20 mg via ORAL

## 2017-09-20 MED ORDER — BISACODYL 10 MG RE SUPP
20 mg | RECTAL | 0 refills | Status: DC
Start: 2017-09-20 — End: 2017-09-21
  Administered 2017-09-20 – 2017-09-21 (×3): 20 mg via RECTAL

## 2017-09-20 MED ORDER — MILK/MOLASSES(#) 1:1 RECTAL ENEMA
1 | Freq: Once | RECTAL | 0 refills | Status: CP
Start: 2017-09-20 — End: ?
  Administered 2017-09-20: 16:00:00 200 mL via RECTAL

## 2017-09-20 MED ORDER — BACLOFEN 10 MG PO TAB
10 mg | Freq: Three times a day (TID) | ORAL | 0 refills | Status: DC
Start: 2017-09-20 — End: 2017-09-25
  Administered 2017-09-20 – 2017-09-25 (×14): 10 mg via ORAL

## 2017-09-20 MED ORDER — GABAPENTIN 300 MG PO CAP
300 mg | Freq: Three times a day (TID) | ORAL | 0 refills | Status: DC
Start: 2017-09-20 — End: 2017-09-25
  Administered 2017-09-20 – 2017-09-25 (×14): 300 mg via ORAL

## 2017-09-20 MED ORDER — TRAMADOL 50 MG PO TAB
50 mg | ORAL | 0 refills | Status: DC
Start: 2017-09-20 — End: 2017-09-21
  Administered 2017-09-21 (×2): 50 mg via ORAL

## 2017-09-20 MED ORDER — ACETAMINOPHEN 325 MG PO TAB
650 mg | ORAL | 0 refills | Status: DC
Start: 2017-09-20 — End: 2017-09-23
  Administered 2017-09-20 – 2017-09-22 (×8): 650 mg via ORAL

## 2017-09-20 MED ORDER — SODIUM CHLORIDE 0.9 % IV SOLP
1000 mL | INTRAVENOUS | 0 refills | Status: CP
Start: 2017-09-20 — End: ?
  Administered 2017-09-20: 15:00:00 1000 mL via INTRAVENOUS

## 2017-09-20 MED ORDER — OXYBUTYNIN CHLORIDE 5 MG PO TAB
5 mg | Freq: Two times a day (BID) | ORAL | 0 refills | Status: DC
Start: 2017-09-20 — End: 2017-09-25
  Administered 2017-09-20 – 2017-09-25 (×10): 5 mg via ORAL

## 2017-09-21 ENCOUNTER — Encounter: Admit: 2017-09-21 | Discharge: 2017-09-21 | Payer: MEDICARE

## 2017-09-21 DIAGNOSIS — M26609 Unspecified temporomandibular joint disorder, unspecified side: Principal | ICD-10-CM

## 2017-09-21 DIAGNOSIS — N319 Neuromuscular dysfunction of bladder, unspecified: ICD-10-CM

## 2017-09-21 DIAGNOSIS — H269 Unspecified cataract: ICD-10-CM

## 2017-09-21 DIAGNOSIS — G35 Multiple sclerosis: ICD-10-CM

## 2017-09-21 DIAGNOSIS — F329 Major depressive disorder, single episode, unspecified: ICD-10-CM

## 2017-09-21 DIAGNOSIS — G4700 Insomnia, unspecified: ICD-10-CM

## 2017-09-21 LAB — MAGNESIUM: Lab: 2 mg/dL — ABNORMAL LOW (ref >60)

## 2017-09-21 LAB — BASIC METABOLIC PANEL: Lab: 137 MMOL/L — ABNORMAL LOW (ref 137–147)

## 2017-09-21 LAB — PHOSPHORUS: Lab: 2.6 mg/dL — ABNORMAL HIGH (ref 2.0–4.5)

## 2017-09-21 LAB — CBC: Lab: 5 K/UL — ABNORMAL LOW (ref >60)

## 2017-09-21 MED ORDER — HYDROCHLOROTHIAZIDE 12.5 MG PO TAB
12.5 mg | Freq: Every day | ORAL | 0 refills | Status: DC
Start: 2017-09-21 — End: 2017-09-25
  Administered 2017-09-21 – 2017-09-25 (×4): 12.5 mg via ORAL

## 2017-09-21 MED ORDER — LABETALOL 5 MG/ML IV SYRG
10 mg | INTRAVENOUS | 0 refills | Status: DC | PRN
Start: 2017-09-21 — End: 2017-09-22

## 2017-09-21 MED ORDER — HYDRALAZINE 20 MG/ML IJ SOLN
10 mg | INTRAVENOUS | 0 refills | Status: DC | PRN
Start: 2017-09-21 — End: 2017-09-22

## 2017-09-21 MED ORDER — LISINOPRIL 10 MG PO TAB
10 mg | Freq: Every day | ORAL | 0 refills | Status: DC
Start: 2017-09-21 — End: 2017-09-25
  Administered 2017-09-21 – 2017-09-25 (×4): 10 mg via ORAL

## 2017-09-21 MED ORDER — BISACODYL 10 MG RE SUPP
20 mg | Freq: Two times a day (BID) | RECTAL | 0 refills | Status: DC | PRN
Start: 2017-09-21 — End: 2017-09-22

## 2017-09-21 MED ORDER — POTASSIUM, SODIUM PHOSPHATES 280-160-250 MG PO PWPK
1 | Freq: Once | ORAL | 0 refills | Status: CP
Start: 2017-09-21 — End: ?
  Administered 2017-09-21: 16:00:00 250 mg via ORAL

## 2017-09-21 MED ORDER — TRAMADOL 50 MG PO TAB
50 mg | ORAL | 0 refills | Status: DC | PRN
Start: 2017-09-21 — End: 2017-09-25
  Administered 2017-09-23: 02:00:00 50 mg via ORAL

## 2017-09-22 ENCOUNTER — Inpatient Hospital Stay: Admit: 2017-09-22 | Discharge: 2017-09-22 | Payer: MEDICARE

## 2017-09-22 DIAGNOSIS — G35 Multiple sclerosis: Secondary | ICD-10-CM

## 2017-09-22 DIAGNOSIS — D62 Acute posthemorrhagic anemia: ICD-10-CM

## 2017-09-22 LAB — MAGNESIUM: Lab: 1.9 mg/dL — ABNORMAL LOW (ref >60)

## 2017-09-22 LAB — PHOSPHORUS: Lab: 2.5 mg/dL — ABNORMAL HIGH (ref 2.0–4.5)

## 2017-09-22 LAB — CBC: Lab: 4.1 K/UL — ABNORMAL LOW (ref 4.5–11.0)

## 2017-09-22 LAB — BASIC METABOLIC PANEL: Lab: 135 MMOL/L — ABNORMAL LOW (ref >60)

## 2017-09-22 MED ORDER — LABETALOL 5 MG/ML IV SYRG
10 mg | Freq: Once | INTRAVENOUS | 0 refills | Status: CP
Start: 2017-09-22 — End: ?
  Administered 2017-09-23: 03:00:00 10 mg via INTRAVENOUS

## 2017-09-22 MED ORDER — DOCUSATE SODIUM 100 MG PO CAP
100 mg | Freq: Two times a day (BID) | ORAL | 0 refills | Status: DC
Start: 2017-09-22 — End: 2017-09-23
  Administered 2017-09-22 – 2017-09-23 (×2): 100 mg via ORAL

## 2017-09-22 MED ORDER — BISACODYL 10 MG RE SUPP
20 mg | Freq: Two times a day (BID) | RECTAL | 0 refills | Status: DC
Start: 2017-09-22 — End: 2017-09-23
  Administered 2017-09-23: 06:00:00 20 mg via RECTAL

## 2017-09-22 MED ORDER — POTASSIUM CHLORIDE 20 MEQ PO TBTQ
40 meq | Freq: Once | ORAL | 0 refills | Status: CP
Start: 2017-09-22 — End: ?
  Administered 2017-09-22: 10:00:00 40 meq via ORAL

## 2017-09-22 MED ORDER — BISACODYL 10 MG RE SUPP
20 mg | Freq: Every day | RECTAL | 0 refills | Status: DC | PRN
Start: 2017-09-22 — End: 2017-09-22

## 2017-09-22 MED ORDER — CALCIUM CARBONATE 200 MG CALCIUM (500 MG) PO CHEW
500 mg | ORAL | 0 refills | Status: DC | PRN
Start: 2017-09-22 — End: 2017-09-25
  Administered 2017-09-22: 14:00:00 500 mg via ORAL

## 2017-09-22 MED ORDER — POLYETHYLENE GLYCOL 3350 17 GRAM PO PWPK
1 | Freq: Every day | ORAL | 0 refills | Status: DC
Start: 2017-09-22 — End: 2017-09-23
  Administered 2017-09-22: 16:00:00 17 g via ORAL

## 2017-09-23 LAB — BASIC METABOLIC PANEL
Lab: 0.9 mg/dL — ABNORMAL LOW (ref 0.4–1.00)
Lab: 100 MMOL/L — ABNORMAL LOW (ref 98–110)
Lab: 104 mg/dL — ABNORMAL HIGH (ref 70–100)
Lab: 12 % (ref 3–12)
Lab: 138 MMOL/L — ABNORMAL LOW (ref >60)
Lab: 26 MMOL/L (ref 21–30)
Lab: 3.7 MMOL/L — ABNORMAL HIGH (ref 3.5–5.1)
Lab: 33 mg/dL — ABNORMAL HIGH (ref 7–25)
Lab: 9.2 mg/dL — ABNORMAL HIGH (ref 8.5–10.6)
Lab: >60 mL/min (ref >60)
Lab: >60 mL/min (ref >60)

## 2017-09-23 LAB — CBC
Lab: 13 % (ref 11–15)
Lab: 27 % — ABNORMAL LOW (ref 36–45)
Lab: 30 pg (ref 26–34)
Lab: 350 10*3/uL (ref 150–400)
Lab: 7.2 10*3/uL (ref 4.5–11.0)
Lab: 8.4 FL (ref 7–11)
Lab: 89 FL (ref 80–100)

## 2017-09-23 LAB — PHOSPHORUS: Lab: 3.3 mg/dL — ABNORMAL LOW (ref 2.0–4.5)

## 2017-09-23 LAB — MAGNESIUM: Lab: 1.7 mg/dL — ABNORMAL LOW (ref 1.6–2.6)

## 2017-09-23 MED ORDER — MILK/MOLASSES(#) 1:1 RECTAL ENEMA
1 | RECTAL | 0 refills | Status: DC
Start: 2017-09-23 — End: 2017-09-23

## 2017-09-23 MED ORDER — HYDRALAZINE 20 MG/ML IJ SOLN
10 mg | INTRAVENOUS | 0 refills | Status: DC | PRN
Start: 2017-09-23 — End: 2017-09-25
  Administered 2017-09-23 – 2017-09-25 (×3): 10 mg via INTRAVENOUS

## 2017-09-23 MED ORDER — DEXTROSE 5%-0.45% SODIUM CHLORIDE & POTASSIUM CHLORIDE 20 MEQ/L IV SOLP
INTRAVENOUS | 0 refills | Status: AC
Start: 2017-09-23 — End: ?
  Administered 2017-09-23 – 2017-09-24 (×4): 1000.000 mL via INTRAVENOUS

## 2017-09-23 MED ORDER — ACETAMINOPHEN 160 MG/5 ML PO SOLN
650 mg | ORAL | 0 refills | Status: DC
Start: 2017-09-23 — End: 2017-09-24
  Administered 2017-09-23 – 2017-09-24 (×3): 650 mg via ORAL

## 2017-09-23 MED ORDER — BISACODYL 10 MG RE SUPP
20 mg | Freq: Two times a day (BID) | RECTAL | 0 refills | Status: DC
Start: 2017-09-23 — End: 2017-09-25
  Administered 2017-09-23 – 2017-09-25 (×4): 20 mg via RECTAL

## 2017-09-23 MED ORDER — MILK/MOLASSES(#) 1:1 RECTAL ENEMA
1 | RECTAL | 0 refills | Status: DC
Start: 2017-09-23 — End: 2017-09-25
  Administered 2017-09-23 – 2017-09-24 (×2): 200 mL via RECTAL

## 2017-09-23 MED ORDER — BISACODYL 10 MG RE SUPP
20 mg | RECTAL | 0 refills | Status: DC
Start: 2017-09-23 — End: 2017-09-23

## 2017-09-24 LAB — BASIC METABOLIC PANEL: Lab: 137 MMOL/L — ABNORMAL LOW (ref >60)

## 2017-09-24 LAB — MAGNESIUM: Lab: 1.5 mg/dL — ABNORMAL LOW (ref 1.6–2.6)

## 2017-09-24 LAB — PHOSPHORUS: Lab: 1.5 mg/dL — ABNORMAL LOW (ref >60)

## 2017-09-24 LAB — CBC: Lab: 8.6 K/UL — ABNORMAL LOW (ref >60)

## 2017-09-24 MED ORDER — ACETAMINOPHEN 325 MG PO TAB
650 mg | ORAL | 0 refills | Status: DC
Start: 2017-09-24 — End: 2017-09-25
  Administered 2017-09-24 – 2017-09-25 (×3): 650 mg via ORAL

## 2017-09-24 MED ORDER — IMS MIXTURE TEMPLATE
30 meq | Freq: Once | ORAL | 0 refills | Status: CP
Start: 2017-09-24 — End: ?
  Administered 2017-09-24 (×2): 30 meq via ORAL

## 2017-09-24 MED ORDER — MAGNESIUM SULFATE IN D5W 1 GRAM/100 ML IV PGBK
1 g | INTRAVENOUS | 0 refills | Status: CP
Start: 2017-09-24 — End: ?
  Administered 2017-09-24 (×2): 1 g via INTRAVENOUS

## 2017-09-24 MED ORDER — POTASSIUM PHOSPHATE IVPB-HIGH
24 MMOL | Freq: Once | INTRAVENOUS | 0 refills | Status: CP
Start: 2017-09-24 — End: ?
  Administered 2017-09-24 (×2): 24 mmol via INTRAVENOUS

## 2017-09-25 ENCOUNTER — Inpatient Hospital Stay: Admit: 2017-09-18 | Discharge: 2017-09-18 | Payer: MEDICARE

## 2017-09-25 ENCOUNTER — Encounter: Admit: 2017-09-17 | Discharge: 2017-09-17 | Payer: MEDICARE

## 2017-09-25 ENCOUNTER — Inpatient Hospital Stay: Admit: 2017-09-25 | Discharge: 2017-10-03 | Payer: BC Managed Care – PPO

## 2017-09-25 ENCOUNTER — Inpatient Hospital Stay: Admit: 2017-09-19 | Discharge: 2017-09-19 | Payer: MEDICARE

## 2017-09-25 ENCOUNTER — Encounter: Admit: 2017-09-18 | Discharge: 2017-09-18 | Payer: MEDICARE

## 2017-09-25 DIAGNOSIS — R188 Other ascites: ICD-10-CM

## 2017-09-25 DIAGNOSIS — G35 Multiple sclerosis: ICD-10-CM

## 2017-09-25 DIAGNOSIS — D62 Acute posthemorrhagic anemia: ICD-10-CM

## 2017-09-25 DIAGNOSIS — A419 Sepsis, unspecified organism: Secondary | ICD-10-CM

## 2017-09-25 DIAGNOSIS — K592 Neurogenic bowel, not elsewhere classified: ICD-10-CM

## 2017-09-25 DIAGNOSIS — J9601 Acute respiratory failure with hypoxia: ICD-10-CM

## 2017-09-25 DIAGNOSIS — R6521 Severe sepsis with septic shock: ICD-10-CM

## 2017-09-25 DIAGNOSIS — J9602 Acute respiratory failure with hypercapnia: ICD-10-CM

## 2017-09-25 DIAGNOSIS — F329 Major depressive disorder, single episode, unspecified: ICD-10-CM

## 2017-09-25 DIAGNOSIS — K562 Volvulus: Principal | ICD-10-CM

## 2017-09-25 DIAGNOSIS — K559 Vascular disorder of intestine, unspecified: ICD-10-CM

## 2017-09-25 DIAGNOSIS — N319 Neuromuscular dysfunction of bladder, unspecified: ICD-10-CM

## 2017-09-25 DIAGNOSIS — G8918 Other acute postprocedural pain: ICD-10-CM

## 2017-09-25 DIAGNOSIS — L0390 Cellulitis, unspecified: ICD-10-CM

## 2017-09-25 DIAGNOSIS — N179 Acute kidney failure, unspecified: ICD-10-CM

## 2017-09-25 DIAGNOSIS — K558 Other vascular disorders of intestine: ICD-10-CM

## 2017-09-25 DIAGNOSIS — I10 Essential (primary) hypertension: ICD-10-CM

## 2017-09-25 DIAGNOSIS — G8220 Paraplegia, unspecified: ICD-10-CM

## 2017-09-25 LAB — MAGNESIUM: Lab: 1.7 mg/dL — ABNORMAL LOW (ref 1.6–2.6)

## 2017-09-25 LAB — CBC: Lab: 10 K/UL — ABNORMAL LOW (ref >60)

## 2017-09-25 LAB — BASIC METABOLIC PANEL: Lab: 137 MMOL/L — ABNORMAL HIGH (ref >60)

## 2017-09-25 LAB — PHOSPHORUS: Lab: 2.4 mg/dL — ABNORMAL LOW (ref 2.0–4.5)

## 2017-09-25 MED ORDER — MAGNESIUM HYDROXIDE 2,400 MG/10 ML PO SUSP
10 mL | ORAL | 0 refills | Status: DC | PRN
Start: 2017-09-25 — End: 2017-10-03

## 2017-09-25 MED ORDER — MELATONIN 3 MG PO TAB
3 mg | Freq: Every evening | ORAL | 0 refills | Status: DC | PRN
Start: 2017-09-25 — End: 2017-10-03

## 2017-09-25 MED ORDER — SENNOSIDES 8.6 MG PO TAB
2 | Freq: Every evening | ORAL | 0 refills | Status: DC
Start: 2017-09-25 — End: 2017-09-26

## 2017-09-25 MED ORDER — CEPHALEXIN 500 MG PO CAP
500 mg | Freq: Four times a day (QID) | ORAL | 0 refills | Status: DC
Start: 2017-09-25 — End: 2017-09-26
  Administered 2017-09-25 – 2017-09-26 (×4): 500 mg via ORAL

## 2017-09-25 MED ORDER — TRAMADOL 50 MG PO TAB
50 mg | ORAL | 0 refills | Status: DC | PRN
Start: 2017-09-25 — End: 2017-10-03

## 2017-09-25 MED ORDER — ACETAMINOPHEN 325 MG PO TAB
650 mg | ORAL | 0 refills | Status: AC | PRN
Start: 2017-09-25 — End: 2017-10-03

## 2017-09-25 MED ORDER — HYDROCHLOROTHIAZIDE 12.5 MG PO TAB
12.5 mg | Freq: Every day | ORAL | 0 refills | Status: DC
Start: 2017-09-25 — End: 2017-10-03
  Administered 2017-09-26 – 2017-10-03 (×8): 12.5 mg via ORAL

## 2017-09-25 MED ORDER — ACETAMINOPHEN 325 MG PO TAB
650 mg | ORAL | 0 refills | Status: DC | PRN
Start: 2017-09-25 — End: 2017-09-26

## 2017-09-25 MED ORDER — OXYBUTYNIN CHLORIDE 5 MG PO TAB
5 mg | Freq: Two times a day (BID) | ORAL | 0 refills | Status: DC
Start: 2017-09-25 — End: 2017-09-29
  Administered 2017-09-26 – 2017-09-29 (×7): 5 mg via ORAL

## 2017-09-25 MED ORDER — LISINOPRIL 10 MG PO TAB
10 mg | Freq: Every day | ORAL | 0 refills | Status: DC
Start: 2017-09-25 — End: 2017-10-03
  Administered 2017-09-26 – 2017-10-03 (×8): 10 mg via ORAL

## 2017-09-25 MED ORDER — DOCUSATE SODIUM 100 MG PO CAP
100 mg | Freq: Two times a day (BID) | ORAL | 0 refills | Status: DC
Start: 2017-09-25 — End: 2017-09-27

## 2017-09-25 MED ORDER — BACLOFEN 10 MG PO TAB
10 mg | Freq: Three times a day (TID) | ORAL | 0 refills | Status: DC
Start: 2017-09-25 — End: 2017-10-03
  Administered 2017-09-25 – 2017-10-03 (×24): 10 mg via ORAL

## 2017-09-25 MED ORDER — ENOXAPARIN 40 MG/0.4 ML SC SYRG
40 mg | Freq: Every day | SUBCUTANEOUS | 0 refills | Status: DC
Start: 2017-09-25 — End: 2017-10-03
  Administered 2017-09-26 – 2017-10-03 (×6): 40 mg via SUBCUTANEOUS

## 2017-09-25 MED ORDER — ONDANSETRON HCL 4 MG PO TAB
4 mg | ORAL | 0 refills | Status: DC | PRN
Start: 2017-09-25 — End: 2017-10-03

## 2017-09-25 MED ORDER — CALCIUM CARBONATE 200 MG CALCIUM (500 MG) PO CHEW
500 mg | ORAL | 0 refills | Status: DC | PRN
Start: 2017-09-25 — End: 2017-10-03

## 2017-09-25 MED ORDER — FLUOXETINE 20 MG PO CAP
20 mg | Freq: Every day | ORAL | 0 refills | Status: DC
Start: 2017-09-25 — End: 2017-10-03
  Administered 2017-09-26 – 2017-10-03 (×8): 20 mg via ORAL

## 2017-09-25 MED ORDER — GABAPENTIN 300 MG PO CAP
300 mg | Freq: Three times a day (TID) | ORAL | 0 refills | Status: DC
Start: 2017-09-25 — End: 2017-10-03
  Administered 2017-09-25 – 2017-10-03 (×24): 300 mg via ORAL

## 2017-09-25 MED ORDER — ALUMINUM-MAGNESIUM HYDROXIDE 200-200 MG/5 ML PO SUSP
30 mL | ORAL | 0 refills | Status: DC | PRN
Start: 2017-09-25 — End: 2017-10-03

## 2017-09-25 MED ORDER — LACTOBACILLUS RHAMNOSUS GG 15 BILLION CELL PO CPSP
1 | Freq: Two times a day (BID) | ORAL | 0 refills | Status: DC
Start: 2017-09-25 — End: 2017-10-03
  Administered 2017-09-25 – 2017-10-03 (×16): 1 via ORAL

## 2017-09-26 ENCOUNTER — Encounter: Admit: 2017-09-26 | Discharge: 2017-09-26 | Payer: MEDICARE

## 2017-09-26 LAB — URINALYSIS DIPSTICK REFLEX TO CULTURE
Lab: NEGATIVE
Lab: NEGATIVE
Lab: NEGATIVE
Lab: NEGATIVE
Lab: NEGATIVE
Lab: NEGATIVE

## 2017-09-26 LAB — URINALYSIS MICROSCOPIC REFLEX TO CULTURE

## 2017-09-26 LAB — CBC CELLULAR THERAPEUTICS
Lab: 12 K/UL — ABNORMAL HIGH (ref >60)
Lab: 3.1 M/UL — ABNORMAL LOW (ref >60)

## 2017-09-26 LAB — C DIFFICILE BY PCR: Lab: NEGATIVE

## 2017-09-26 LAB — BASIC METABOLIC PANEL CELLULAR THERAPEUTICS: Lab: 134 MMOL/L — ABNORMAL LOW (ref <150)

## 2017-09-26 MED ORDER — SODIUM CHLORIDE 0.9 % IV SOLP
1000 mL | INTRAVENOUS | 0 refills | Status: DC
Start: 2017-09-26 — End: 2017-09-26

## 2017-09-26 MED ORDER — PATIENTS OWN MEDICATION
1 | SUBCUTANEOUS | 0 refills | Status: DC
Start: 2017-09-26 — End: 2017-09-26

## 2017-09-26 MED ORDER — MAGNESIUM OXIDE 400 MG (241.3 MG MAGNESIUM) PO TAB
400 mg | Freq: Two times a day (BID) | ORAL | 0 refills | Status: CP
Start: 2017-09-26 — End: ?
  Administered 2017-09-26 (×2): 400 mg via ORAL

## 2017-09-26 MED ORDER — PIPERACILLIN/TAZOBACTAM 3.375 G/NS IVPB (MB+)
3.375 g | INTRAVENOUS | 0 refills | Status: DC
Start: 2017-09-26 — End: 2017-10-02
  Administered 2017-09-26 – 2017-10-02 (×48): 3.375 g via INTRAVENOUS

## 2017-09-26 MED ORDER — SODIUM CHLORIDE 0.9 % IV SOLP
1000 mL | INTRAVENOUS | 0 refills | Status: CP
Start: 2017-09-26 — End: ?
  Administered 2017-09-26: 17:00:00 1000 mL via INTRAVENOUS

## 2017-09-26 MED ORDER — INTERFERON BETA-1B 0.3 MG SC KIT
0.25 mg | SUBCUTANEOUS | 0 refills | Status: DC
Start: 2017-09-26 — End: 2017-09-26

## 2017-09-26 MED ORDER — POTASSIUM CHLORIDE 20 MEQ PO TBTQ
40 meq | Freq: Two times a day (BID) | ORAL | 0 refills | Status: CP
Start: 2017-09-26 — End: ?
  Administered 2017-09-26 (×2): 40 meq via ORAL

## 2017-09-26 MED ORDER — INTERFERON BETA-1B 0.3 MG SC KIT
.3 mg | SUBCUTANEOUS | 0 refills | Status: DC
Start: 2017-09-26 — End: 2017-10-03

## 2017-09-27 ENCOUNTER — Encounter: Admit: 2017-09-27 | Discharge: 2017-09-27 | Payer: MEDICARE

## 2017-09-27 LAB — GRAM STAIN

## 2017-09-27 LAB — CBC CELLULAR THERAPEUTICS
Lab: 11 K/UL — ABNORMAL HIGH (ref 4.5–11.0)
Lab: 2.7 M/UL — ABNORMAL LOW (ref >60)

## 2017-09-27 LAB — BASIC METABOLIC PANEL CELLULAR THERAPEUTICS: Lab: 135 MMOL/L — ABNORMAL LOW (ref >60)

## 2017-09-27 MED ORDER — SODIUM CHLORIDE 0.9 % IJ SOLN
50 mL | Freq: Once | INTRAVENOUS | 0 refills | Status: CP
Start: 2017-09-27 — End: ?
  Administered 2017-09-27: 21:00:00 50 mL via INTRAVENOUS

## 2017-09-27 MED ORDER — VANCOMYCIN 1G/250ML D5W IVPB (VIAL2BAG)
1 g | Freq: Once | INTRAVENOUS | 0 refills | Status: CP
Start: 2017-09-27 — End: ?
  Administered 2017-09-27 (×2): 1000 mg via INTRAVENOUS

## 2017-09-27 MED ORDER — IOHEXOL 350 MG IODINE/ML IV SOLN
80 mL | Freq: Once | INTRAVENOUS | 0 refills | Status: CP
Start: 2017-09-27 — End: ?
  Administered 2017-09-27: 21:00:00 80 mL via INTRAVENOUS

## 2017-09-27 MED ORDER — DOCUSATE SODIUM 100 MG PO CAP
100 mg | Freq: Every day | ORAL | 0 refills | Status: DC
Start: 2017-09-27 — End: 2017-10-03
  Administered 2017-09-28 – 2017-10-03 (×6): 100 mg via ORAL

## 2017-09-27 MED ORDER — VANCOMYCIN IN DEXTROSE 5 % 750 MG/150 ML IV PGBK
15 mg/kg | Freq: Two times a day (BID) | INTRAVENOUS | 0 refills | Status: DC
Start: 2017-09-27 — End: 2017-09-30
  Administered 2017-09-28 – 2017-09-30 (×6): 750 mg via INTRAVENOUS

## 2017-09-27 MED ORDER — HYDROXYZINE HCL 25 MG PO TAB
25 mg | Freq: Two times a day (BID) | ORAL | 0 refills | Status: DC | PRN
Start: 2017-09-27 — End: 2017-10-03

## 2017-09-28 LAB — BASIC METABOLIC PANEL CELLULAR THERAPEUTICS: Lab: 138 MMOL/L — ABNORMAL LOW (ref >60)

## 2017-09-28 LAB — CBC CELLULAR THERAPEUTICS: Lab: 8.4 K/UL — ABNORMAL LOW (ref >60)

## 2017-09-29 LAB — CBC CELLULAR THERAPEUTICS: Lab: 5.5 10*3/uL (ref >60)

## 2017-09-29 LAB — VANCOMYCIN TROUGH: Lab: 12 ug/mL (ref 10.0–20.0)

## 2017-09-29 LAB — BASIC METABOLIC PANEL CELLULAR THERAPEUTICS: Lab: 138 MMOL/L — ABNORMAL LOW (ref 137–147)

## 2017-09-29 MED ORDER — OXYBUTYNIN CHLORIDE 5 MG PO TAB
5 mg | Freq: Three times a day (TID) | ORAL | 0 refills | Status: DC
Start: 2017-09-29 — End: 2017-10-03
  Administered 2017-09-29 – 2017-10-03 (×13): 5 mg via ORAL

## 2017-09-30 LAB — CULTURE-WOUND/TISSUE/FLUID(AEROBIC ONLY)W/SENSITIVITY

## 2017-09-30 LAB — CBC CELLULAR THERAPEUTICS: Lab: 6 K/UL — ABNORMAL LOW (ref 4.5–11.0)

## 2017-09-30 LAB — BASIC METABOLIC PANEL CELLULAR THERAPEUTICS: Lab: 139 MMOL/L — ABNORMAL LOW (ref >60)

## 2017-09-30 MED ORDER — SODIUM CHLORIDE 0.9 % FLUSH
3-5 mL | Freq: Three times a day (TID) | 0 refills | Status: DC
Start: 2017-09-30 — End: 2017-10-03

## 2017-10-02 LAB — CULTURE-ANAEROBIC

## 2017-10-02 LAB — CULTURE-BLOOD W/SENSITIVITY

## 2017-10-02 LAB — BASIC METABOLIC PANEL CELLULAR THERAPEUTICS: Lab: 140 MMOL/L — ABNORMAL LOW (ref >60)

## 2017-10-02 LAB — CBC CELLULAR THERAPEUTICS: Lab: 6.6 K/UL — ABNORMAL LOW (ref 4.5–11.0)

## 2017-10-02 MED ORDER — AMOXICILLIN-POT CLAVULANATE 875-125 MG PO TAB
875 mg | Freq: Two times a day (BID) | ORAL | 0 refills | Status: DC
Start: 2017-10-02 — End: 2017-10-03
  Administered 2017-10-03 (×2): 875 mg via ORAL

## 2017-10-02 MED ORDER — SULFAMETHOXAZOLE-TRIMETHOPRIM 800-160 MG PO TAB
1 | Freq: Two times a day (BID) | ORAL | 0 refills | Status: DC
Start: 2017-10-02 — End: 2017-10-02
  Administered 2017-10-02: 14:00:00 1 via ORAL

## 2017-10-02 MED ORDER — SULFAMETHOXAZOLE-TRIMETHOPRIM 800-160 MG PO TAB
1 | Freq: Two times a day (BID) | ORAL | 0 refills | Status: DC
Start: 2017-10-02 — End: 2017-10-03
  Administered 2017-10-03 (×2): 1 via ORAL

## 2017-10-02 MED ORDER — SULFAMETHOXAZOLE-TRIMETHOPRIM 800-160 MG PO TAB
1 | ORAL_TABLET | Freq: Two times a day (BID) | ORAL | 0 refills | Status: CN
Start: 2017-10-02 — End: ?

## 2017-10-03 ENCOUNTER — Encounter: Admit: 2017-10-03 | Discharge: 2017-10-03 | Payer: MEDICARE

## 2017-10-03 ENCOUNTER — Inpatient Hospital Stay: Admit: 2017-09-27 | Discharge: 2017-09-27 | Payer: MEDICARE

## 2017-10-03 DIAGNOSIS — R5381 Other malaise: ICD-10-CM

## 2017-10-03 DIAGNOSIS — K592 Neurogenic bowel, not elsewhere classified: ICD-10-CM

## 2017-10-03 DIAGNOSIS — G35 Multiple sclerosis: ICD-10-CM

## 2017-10-03 DIAGNOSIS — I10 Essential (primary) hypertension: ICD-10-CM

## 2017-10-03 DIAGNOSIS — G4700 Insomnia, unspecified: ICD-10-CM

## 2017-10-03 DIAGNOSIS — R339 Retention of urine, unspecified: ICD-10-CM

## 2017-10-03 DIAGNOSIS — N3289 Other specified disorders of bladder: ICD-10-CM

## 2017-10-03 DIAGNOSIS — T8149XA Infection following a procedure, other surgical site, initial encounter: Principal | ICD-10-CM

## 2017-10-03 DIAGNOSIS — B999 Unspecified infectious disease: ICD-10-CM

## 2017-10-03 DIAGNOSIS — F329 Major depressive disorder, single episode, unspecified: ICD-10-CM

## 2017-10-03 DIAGNOSIS — D649 Anemia, unspecified: ICD-10-CM

## 2017-10-03 DIAGNOSIS — A419 Sepsis, unspecified organism: ICD-10-CM

## 2017-10-03 DIAGNOSIS — H269 Unspecified cataract: ICD-10-CM

## 2017-10-03 DIAGNOSIS — Z48815 Encounter for surgical aftercare following surgery on the digestive system: Principal | ICD-10-CM

## 2017-10-03 DIAGNOSIS — T8141XA Infection following a procedure, superficial incisional surgical site, initial encounter: ICD-10-CM

## 2017-10-03 DIAGNOSIS — L03311 Cellulitis of abdominal wall: ICD-10-CM

## 2017-10-03 DIAGNOSIS — N319 Neuromuscular dysfunction of bladder, unspecified: ICD-10-CM

## 2017-10-03 MED ORDER — OXYBUTYNIN CHLORIDE 5 MG PO TAB
5 mg | ORAL_TABLET | Freq: Three times a day (TID) | ORAL | 0 refills | 30.00000 days | Status: AC
Start: 2017-10-03 — End: 2018-01-22

## 2017-10-03 MED ORDER — SULFAMETHOXAZOLE-TRIMETHOPRIM 800-160 MG PO TAB
1 | ORAL_TABLET | Freq: Two times a day (BID) | ORAL | 0 refills | Status: AC
Start: 2017-10-03 — End: 2017-10-10

## 2017-10-03 MED ORDER — HYDROCHLOROTHIAZIDE 12.5 MG PO TAB
12.5 mg | ORAL_TABLET | Freq: Every day | ORAL | 1 refills | 90.00000 days | Status: AC
Start: 2017-10-03 — End: 2018-04-11

## 2017-10-03 MED ORDER — MELATONIN 3 MG PO TAB
3 mg | Freq: Every evening | ORAL | 0 refills | 30.00000 days | Status: AC | PRN
Start: 2017-10-03 — End: 2017-10-11

## 2017-10-03 MED ORDER — AMOXICILLIN-POT CLAVULANATE 875-125 MG PO TAB
875 mg | ORAL_TABLET | Freq: Two times a day (BID) | ORAL | 0 refills | 10.00000 days | Status: AC
Start: 2017-10-03 — End: 2017-10-10

## 2017-10-03 MED ORDER — OXYBUTYNIN CHLORIDE 5 MG PO TAB
5 mg | ORAL_TABLET | Freq: Three times a day (TID) | ORAL | 0 refills | 30.00000 days | Status: AC
Start: 2017-10-03 — End: 2017-10-03

## 2017-10-03 MED ORDER — LACTOBACILLUS RHAMNOSUS GG 15 BILLION CELL PO CPSP
1 | ORAL_CAPSULE | Freq: Two times a day (BID) | ORAL | 3 refills | 30.00000 days | Status: AC
Start: 2017-10-03 — End: 2017-10-11

## 2017-10-03 MED ORDER — DOCUSATE SODIUM 100 MG PO CAP
100 mg | ORAL_CAPSULE | Freq: Every day | ORAL | 3 refills | Status: AC
Start: 2017-10-03 — End: ?

## 2017-10-03 MED ORDER — LISINOPRIL 10 MG PO TAB
10 mg | ORAL_TABLET | Freq: Every day | ORAL | 1 refills | Status: AC
Start: 2017-10-03 — End: 2017-10-11

## 2017-10-09 ENCOUNTER — Encounter: Admit: 2017-10-09 | Discharge: 2017-10-09 | Payer: MEDICARE

## 2017-10-09 DIAGNOSIS — Z79899 Other long term (current) drug therapy: Secondary | ICD-10-CM

## 2017-10-09 DIAGNOSIS — E559 Vitamin D deficiency, unspecified: ICD-10-CM

## 2017-10-09 DIAGNOSIS — G35 Multiple sclerosis: Principal | ICD-10-CM

## 2017-10-09 LAB — COMPREHENSIVE METABOLIC PANEL
Lab: 0.2
Lab: 1.7
Lab: 104
Lab: 131
Lab: 17
Lab: 17
Lab: 18
Lab: 25
Lab: 3.1
Lab: 33
Lab: 5.8
Lab: 60
Lab: 8
Lab: 81
Lab: 9.7
Lab: 97

## 2017-10-09 LAB — CREATININE: Lab: 1.7

## 2017-10-09 LAB — CBC AND DIFF
Lab: 2.9
Lab: 27
Lab: 29
Lab: 32
Lab: 4.1
Lab: 8.7
Lab: 91

## 2017-10-09 LAB — ALK PHOS TOTAL: Lab: 81

## 2017-10-09 LAB — 25-OH VITAMIN D (D2 + D3): Lab: 47 mg/dL (ref 0.3–1.2)

## 2017-10-09 LAB — PLATELET COUNT: Lab: 414

## 2017-10-09 LAB — AST (SGOT): Lab: 17

## 2017-10-09 LAB — POTASSIUM: Lab: 5.8

## 2017-10-09 LAB — ALT (SGPT): Lab: 18

## 2017-10-09 LAB — CBC: Lab: 4.1

## 2017-10-09 LAB — HEMOGLOBIN: Lab: 8.7

## 2017-10-09 LAB — BUN: Lab: 17

## 2017-10-09 MED ORDER — SODIUM POLYSTYRENE SULFONATE 15 GRAM/60 ML PO SUSP
30 g | Freq: Once | ORAL | 0 refills | 2.00000 days | Status: AC
Start: 2017-10-09 — End: 2017-10-10
  Filled 2017-10-10: qty 120, 1d supply

## 2017-10-10 ENCOUNTER — Ambulatory Visit: Admit: 2017-10-10 | Discharge: 2017-10-10 | Payer: MEDICARE

## 2017-10-10 ENCOUNTER — Encounter: Admit: 2017-10-10 | Discharge: 2017-10-10 | Payer: MEDICARE

## 2017-10-10 ENCOUNTER — Ambulatory Visit: Admit: 2017-10-10 | Discharge: 2017-10-10 | Payer: BC Managed Care – PPO

## 2017-10-10 DIAGNOSIS — T8149XA Infection following a procedure, other surgical site, initial encounter: Principal | ICD-10-CM

## 2017-10-10 DIAGNOSIS — G35 Multiple sclerosis: ICD-10-CM

## 2017-10-10 DIAGNOSIS — N179 Acute kidney failure, unspecified: ICD-10-CM

## 2017-10-10 DIAGNOSIS — G4700 Insomnia, unspecified: ICD-10-CM

## 2017-10-10 DIAGNOSIS — N319 Neuromuscular dysfunction of bladder, unspecified: ICD-10-CM

## 2017-10-10 DIAGNOSIS — F329 Major depressive disorder, single episode, unspecified: ICD-10-CM

## 2017-10-10 DIAGNOSIS — H269 Unspecified cataract: ICD-10-CM

## 2017-10-10 DIAGNOSIS — M26609 Unspecified temporomandibular joint disorder, unspecified side: Principal | ICD-10-CM

## 2017-10-10 DIAGNOSIS — E875 Hyperkalemia: ICD-10-CM

## 2017-10-10 DIAGNOSIS — Z9889 Other specified postprocedural states: ICD-10-CM

## 2017-10-10 MED ORDER — AMOXICILLIN-POT CLAVULANATE 500-125 MG PO TAB
1 | ORAL_TABLET | Freq: Two times a day (BID) | ORAL | 0 refills | 10.00000 days | Status: AC
Start: 2017-10-10 — End: 2017-10-24

## 2017-10-10 MED ORDER — LEVOFLOXACIN 750 MG PO TAB
750 mg | ORAL_TABLET | ORAL | 1 refills | 10.00000 days | Status: AC
Start: 2017-10-10 — End: 2018-04-11

## 2017-10-10 MED ORDER — INTERFERON BETA-1B 0.3 MG SC KIT
ORAL | 5 refills | 28.00000 days | Status: AC
Start: 2017-10-10 — End: 2017-10-31

## 2017-10-10 MED ORDER — SODIUM POLYSTYRENE SULFONATE 15 GRAM/60 ML PO SUSP
30 g | Freq: Once | ORAL | 0 refills | 2.00000 days | Status: AC
Start: 2017-10-10 — End: ?

## 2017-10-11 ENCOUNTER — Ambulatory Visit: Admit: 2017-10-11 | Discharge: 2017-10-12 | Payer: BC Managed Care – PPO

## 2017-10-11 ENCOUNTER — Encounter: Admit: 2017-10-11 | Discharge: 2017-10-11 | Payer: MEDICARE

## 2017-10-11 DIAGNOSIS — T8149XA Infection following a procedure, other surgical site, initial encounter: Principal | ICD-10-CM

## 2017-10-11 DIAGNOSIS — N319 Neuromuscular dysfunction of bladder, unspecified: ICD-10-CM

## 2017-10-11 DIAGNOSIS — H269 Unspecified cataract: ICD-10-CM

## 2017-10-11 DIAGNOSIS — G35 Multiple sclerosis: Principal | ICD-10-CM

## 2017-10-11 DIAGNOSIS — D62 Acute posthemorrhagic anemia: ICD-10-CM

## 2017-10-11 DIAGNOSIS — G4700 Insomnia, unspecified: ICD-10-CM

## 2017-10-11 DIAGNOSIS — F329 Major depressive disorder, single episode, unspecified: ICD-10-CM

## 2017-10-11 DIAGNOSIS — M26609 Unspecified temporomandibular joint disorder, unspecified side: Principal | ICD-10-CM

## 2017-10-11 DIAGNOSIS — D649 Anemia, unspecified: ICD-10-CM

## 2017-10-11 DIAGNOSIS — E559 Vitamin D deficiency, unspecified: ICD-10-CM

## 2017-10-11 DIAGNOSIS — Z79899 Other long term (current) drug therapy: Secondary | ICD-10-CM

## 2017-10-12 ENCOUNTER — Encounter: Admit: 2017-10-12 | Discharge: 2017-10-12 | Payer: MEDICARE

## 2017-10-12 LAB — AST (SGOT): Lab: 16

## 2017-10-12 LAB — CBC: Lab: 2.8

## 2017-10-12 LAB — ALK PHOS TOTAL: Lab: 82

## 2017-10-12 LAB — CREATININE: Lab: 1.4

## 2017-10-12 LAB — POTASSIUM: Lab: 4

## 2017-10-12 LAB — PLATELET COUNT: Lab: 350

## 2017-10-12 LAB — BUN: Lab: 18

## 2017-10-12 LAB — HEMOGLOBIN: Lab: 9.2

## 2017-10-12 LAB — ALT (SGPT): Lab: 18

## 2017-10-16 ENCOUNTER — Encounter: Admit: 2017-10-16 | Discharge: 2017-10-16 | Payer: MEDICARE

## 2017-10-16 DIAGNOSIS — H269 Unspecified cataract: ICD-10-CM

## 2017-10-16 DIAGNOSIS — G4700 Insomnia, unspecified: ICD-10-CM

## 2017-10-16 DIAGNOSIS — F329 Major depressive disorder, single episode, unspecified: ICD-10-CM

## 2017-10-16 DIAGNOSIS — M26609 Unspecified temporomandibular joint disorder, unspecified side: Principal | ICD-10-CM

## 2017-10-16 DIAGNOSIS — N319 Neuromuscular dysfunction of bladder, unspecified: ICD-10-CM

## 2017-10-16 DIAGNOSIS — G35 Multiple sclerosis: ICD-10-CM

## 2017-10-16 LAB — CREATININE: Lab: 1.3

## 2017-10-16 LAB — POTASSIUM: Lab: 4.1

## 2017-10-16 LAB — PLATELET COUNT: Lab: 324

## 2017-10-16 LAB — BUN: Lab: 18

## 2017-10-16 LAB — HEMOGLOBIN: Lab: 10

## 2017-10-16 LAB — CBC: Lab: 3.5

## 2017-10-19 LAB — CBC: Lab: 3.9

## 2017-10-19 LAB — POTASSIUM: Lab: 4

## 2017-10-19 LAB — BUN: Lab: 21

## 2017-10-19 LAB — PLATELET COUNT: Lab: 321

## 2017-10-19 LAB — AST (SGOT): Lab: 16

## 2017-10-19 LAB — ALK PHOS TOTAL: Lab: 74

## 2017-10-19 LAB — HEMOGLOBIN: Lab: 9.2

## 2017-10-19 LAB — ALT (SGPT): Lab: 18

## 2017-10-19 LAB — CREATININE: Lab: 1.3

## 2017-10-20 ENCOUNTER — Encounter: Admit: 2017-10-20 | Discharge: 2017-10-20 | Payer: MEDICARE

## 2017-10-23 LAB — CBC: Lab: 3.5 MMOL/L (ref 21–30)

## 2017-10-23 LAB — BUN: Lab: 22

## 2017-10-23 LAB — PLATELET COUNT: Lab: 342 U/L (ref 7–56)

## 2017-10-23 LAB — CREATININE: Lab: 1.1 10*3/uL (ref 3–12)

## 2017-10-23 LAB — HEMOGLOBIN: Lab: 8.9 U/L — ABNORMAL HIGH (ref 25–110)

## 2017-10-23 LAB — ALT (SGPT): Lab: 15 10*3/uL (ref 0–0.20)

## 2017-10-23 LAB — POTASSIUM: Lab: 4.7 mL/min (ref >60)

## 2017-10-23 LAB — ALK PHOS TOTAL: Lab: 74 mL/min — ABNORMAL LOW (ref >60)

## 2017-10-23 LAB — AST (SGOT): Lab: 16 10*3/uL (ref 0–0.45)

## 2017-10-24 ENCOUNTER — Encounter: Admit: 2017-10-24 | Discharge: 2017-10-24 | Payer: MEDICARE

## 2017-10-24 ENCOUNTER — Ambulatory Visit: Admit: 2017-10-24 | Discharge: 2017-10-24 | Payer: MEDICARE

## 2017-10-24 ENCOUNTER — Ambulatory Visit: Admit: 2017-10-24 | Discharge: 2017-10-24 | Payer: BC Managed Care – PPO

## 2017-10-24 DIAGNOSIS — N319 Neuromuscular dysfunction of bladder, unspecified: ICD-10-CM

## 2017-10-24 DIAGNOSIS — Z4889 Encounter for other specified surgical aftercare: Principal | ICD-10-CM

## 2017-10-24 DIAGNOSIS — G4700 Insomnia, unspecified: ICD-10-CM

## 2017-10-24 DIAGNOSIS — G35 Multiple sclerosis: ICD-10-CM

## 2017-10-24 DIAGNOSIS — H269 Unspecified cataract: ICD-10-CM

## 2017-10-24 DIAGNOSIS — M26609 Unspecified temporomandibular joint disorder, unspecified side: Principal | ICD-10-CM

## 2017-10-24 DIAGNOSIS — F329 Major depressive disorder, single episode, unspecified: ICD-10-CM

## 2017-10-24 DIAGNOSIS — T8149XA Infection following a procedure, other surgical site, initial encounter: Principal | ICD-10-CM

## 2017-10-25 ENCOUNTER — Encounter: Admit: 2017-10-25 | Discharge: 2017-10-25 | Payer: MEDICARE

## 2017-10-25 DIAGNOSIS — Z79899 Other long term (current) drug therapy: Secondary | ICD-10-CM

## 2017-10-25 DIAGNOSIS — G35 Multiple sclerosis: Principal | ICD-10-CM

## 2017-10-25 DIAGNOSIS — E559 Vitamin D deficiency, unspecified: ICD-10-CM

## 2017-10-29 ENCOUNTER — Encounter: Admit: 2017-10-29 | Discharge: 2017-10-29 | Payer: MEDICARE

## 2017-10-30 MED ORDER — FLUOXETINE 20 MG PO CAP
ORAL_CAPSULE | Freq: Every day | 1 refills | Status: AC
Start: 2017-10-30 — End: 2018-02-08

## 2017-10-31 ENCOUNTER — Encounter: Admit: 2017-10-31 | Discharge: 2017-10-31 | Payer: MEDICARE

## 2017-10-31 DIAGNOSIS — G35 Multiple sclerosis: Principal | ICD-10-CM

## 2017-10-31 MED ORDER — INTERFERON BETA-1B 0.3 MG SC KIT
ORAL | 5 refills | 28.00000 days | Status: AC
Start: 2017-10-31 — End: 2018-02-07

## 2017-11-02 ENCOUNTER — Encounter: Admit: 2017-11-02 | Discharge: 2017-11-02 | Payer: MEDICARE

## 2017-11-03 ENCOUNTER — Encounter: Admit: 2017-11-03 | Discharge: 2017-11-03 | Payer: MEDICARE

## 2017-11-14 ENCOUNTER — Encounter: Admit: 2017-11-14 | Discharge: 2017-11-14 | Payer: MEDICARE

## 2017-11-26 ENCOUNTER — Encounter: Admit: 2017-11-26 | Discharge: 2017-11-26 | Payer: MEDICARE

## 2018-01-21 ENCOUNTER — Encounter: Admit: 2018-01-21 | Discharge: 2018-01-21 | Payer: MEDICARE

## 2018-01-22 ENCOUNTER — Encounter: Admit: 2018-01-22 | Discharge: 2018-01-22 | Payer: MEDICARE

## 2018-01-22 MED ORDER — OXYBUTYNIN CHLORIDE 5 MG PO TAB
ORAL_TABLET | Freq: Two times a day (BID) | 1 refills | 30.00000 days | Status: AC
Start: 2018-01-22 — End: 2018-05-16

## 2018-01-30 ENCOUNTER — Encounter: Admit: 2018-01-30 | Discharge: 2018-01-30 | Payer: MEDICARE

## 2018-02-06 ENCOUNTER — Encounter: Admit: 2018-02-06 | Discharge: 2018-02-06 | Payer: MEDICARE

## 2018-02-06 MED ORDER — BACLOFEN 10 MG PO TAB
10 mg | ORAL_TABLET | Freq: Three times a day (TID) | ORAL | 1 refills | Status: AC
Start: 2018-02-06 — End: 2018-08-28

## 2018-02-06 MED ORDER — GABAPENTIN 300 MG PO CAP
300 mg | ORAL_CAPSULE | Freq: Three times a day (TID) | ORAL | 1 refills | Status: AC
Start: 2018-02-06 — End: 2018-08-28

## 2018-02-07 ENCOUNTER — Encounter: Admit: 2018-02-07 | Discharge: 2018-02-07 | Payer: MEDICARE

## 2018-02-07 DIAGNOSIS — G35 Multiple sclerosis: Secondary | ICD-10-CM

## 2018-02-07 MED ORDER — INTERFERON BETA-1B 0.3 MG SC KIT
ORAL | 5 refills | 28.00000 days | Status: AC
Start: 2018-02-07 — End: 2018-07-18

## 2018-02-08 ENCOUNTER — Encounter: Admit: 2018-02-08 | Discharge: 2018-02-08 | Payer: MEDICARE

## 2018-02-08 MED ORDER — FLUOXETINE 20 MG PO CAP
20 mg | ORAL_CAPSULE | Freq: Every day | ORAL | 1 refills | Status: AC
Start: 2018-02-08 — End: 2018-08-28

## 2018-02-09 ENCOUNTER — Encounter: Admit: 2018-02-09 | Discharge: 2018-02-09 | Payer: MEDICARE

## 2018-02-16 ENCOUNTER — Encounter: Admit: 2018-02-16 | Discharge: 2018-02-16 | Payer: MEDICARE

## 2018-02-27 ENCOUNTER — Encounter: Admit: 2018-02-27 | Discharge: 2018-02-27 | Payer: MEDICARE

## 2018-02-27 MED ORDER — NITROFURANTOIN MACROCRYSTAL 50 MG PO CAP
50 mg | ORAL_CAPSULE | Freq: Every evening | ORAL | 0 refills | 7.00000 days | Status: AC
Start: 2018-02-27 — End: 2018-10-12

## 2018-03-23 ENCOUNTER — Encounter: Admit: 2018-03-23 | Discharge: 2018-03-23 | Payer: MEDICARE

## 2018-03-26 ENCOUNTER — Encounter: Admit: 2018-03-26 | Discharge: 2018-03-26 | Payer: MEDICARE

## 2018-03-26 DIAGNOSIS — E559 Vitamin D deficiency, unspecified: ICD-10-CM

## 2018-03-26 DIAGNOSIS — G35 Multiple sclerosis: Principal | ICD-10-CM

## 2018-03-26 DIAGNOSIS — Z79899 Other long term (current) drug therapy: Secondary | ICD-10-CM

## 2018-03-26 LAB — COMPREHENSIVE METABOLIC PANEL
Lab: 0.2 mg/dL — ABNORMAL LOW (ref 0.4–1.00)
Lab: 104
Lab: 36 MMOL/L — ABNORMAL LOW (ref 137–147)
Lab: 4.7
Lab: 6.8 s (ref 24.0–36.5)

## 2018-03-26 LAB — CBC AND DIFF
Lab: 3.8
Lab: 31
Lab: 2.5
Lab: 9.9

## 2018-04-09 ENCOUNTER — Encounter: Admit: 2018-04-09 | Discharge: 2018-04-09 | Payer: MEDICARE

## 2018-04-11 ENCOUNTER — Encounter: Admit: 2018-04-11 | Discharge: 2018-04-11 | Payer: MEDICARE

## 2018-04-11 DIAGNOSIS — G35 Multiple sclerosis: Principal | ICD-10-CM

## 2018-05-15 ENCOUNTER — Encounter: Admit: 2018-05-15 | Discharge: 2018-05-15 | Payer: MEDICARE

## 2018-05-16 MED ORDER — OXYBUTYNIN CHLORIDE 5 MG PO TAB
5 mg | ORAL_TABLET | Freq: Three times a day (TID) | ORAL | 1 refills | 30.00000 days | Status: DC
Start: 2018-05-16 — End: 2018-10-12

## 2018-05-16 NOTE — Telephone Encounter
Refill sent for oxybutynin 5 mg TID, per patient reported dose, patient has gone back and fourth with BID and TID dosing refilled by Dr.  Lawernce Ion and Dr. Burnadette Peter. Refill sent

## 2018-07-18 ENCOUNTER — Encounter: Admit: 2018-07-18 | Discharge: 2018-07-18

## 2018-07-18 DIAGNOSIS — G35 Multiple sclerosis: Secondary | ICD-10-CM

## 2018-07-18 MED ORDER — BETASERON 0.3 MG SC KIT
5 refills | Status: DC
Start: 2018-07-18 — End: 2018-10-22

## 2018-07-18 NOTE — Telephone Encounter
Refill request for betaseron, last office visit 04-11-18 rx included in plan of care. Refill sent

## 2018-08-28 ENCOUNTER — Encounter: Admit: 2018-08-28 | Discharge: 2018-08-28 | Payer: MEDICARE

## 2018-08-28 MED ORDER — FLUOXETINE 20 MG PO CAP
ORAL_CAPSULE | Freq: Every day | 1 refills | Status: DC
Start: 2018-08-28 — End: 2018-10-12

## 2018-08-28 MED ORDER — BACLOFEN 10 MG PO TAB
ORAL_TABLET | Freq: Three times a day (TID) | 1 refills | Status: DC
Start: 2018-08-28 — End: 2018-10-12

## 2018-08-28 MED ORDER — GABAPENTIN 300 MG PO CAP
ORAL_CAPSULE | Freq: Three times a day (TID) | 1 refills | Status: DC
Start: 2018-08-28 — End: 2019-03-20

## 2018-08-28 NOTE — Telephone Encounter
Refill of baclofen, gabapentin, and fluoxetine sent to pharmacy patient has office visit scheduled 10-12-18

## 2018-10-08 ENCOUNTER — Encounter

## 2018-10-08 DIAGNOSIS — G35 Multiple sclerosis: Secondary | ICD-10-CM

## 2018-10-08 DIAGNOSIS — E559 Vitamin D deficiency, unspecified: Secondary | ICD-10-CM

## 2018-10-08 DIAGNOSIS — Z79899 Other long term (current) drug therapy: Secondary | ICD-10-CM

## 2018-10-11 ENCOUNTER — Encounter: Admit: 2018-10-11 | Discharge: 2018-10-11 | Payer: MEDICARE

## 2018-10-12 ENCOUNTER — Ambulatory Visit: Admit: 2018-10-12 | Discharge: 2018-10-13 | Payer: BC Managed Care – PPO

## 2018-10-12 ENCOUNTER — Encounter: Admit: 2018-10-12 | Discharge: 2018-10-12 | Payer: MEDICARE

## 2018-10-12 DIAGNOSIS — G35 Multiple sclerosis: Secondary | ICD-10-CM

## 2018-10-12 DIAGNOSIS — F329 Major depressive disorder, single episode, unspecified: Secondary | ICD-10-CM

## 2018-10-12 DIAGNOSIS — G4700 Insomnia, unspecified: Secondary | ICD-10-CM

## 2018-10-12 DIAGNOSIS — N319 Neuromuscular dysfunction of bladder, unspecified: Secondary | ICD-10-CM

## 2018-10-12 DIAGNOSIS — H269 Unspecified cataract: Secondary | ICD-10-CM

## 2018-10-12 DIAGNOSIS — M26609 Unspecified temporomandibular joint disorder, unspecified side: Secondary | ICD-10-CM

## 2018-10-12 MED ORDER — OXYBUTYNIN CHLORIDE 5 MG PO TAB
5 mg | ORAL_TABLET | Freq: Three times a day (TID) | ORAL | 1 refills | 30.00000 days | Status: DC
Start: 2018-10-12 — End: 2019-06-19

## 2018-10-12 MED ORDER — BACLOFEN 10 MG PO TAB
10 mg | ORAL_TABLET | Freq: Three times a day (TID) | ORAL | 1 refills | Status: DC
Start: 2018-10-12 — End: 2019-06-19

## 2018-10-12 MED ORDER — FLUOXETINE 20 MG PO CAP
20 mg | ORAL_CAPSULE | Freq: Every day | ORAL | 1 refills | Status: DC
Start: 2018-10-12 — End: 2019-06-19

## 2018-10-17 LAB — CBC AND DIFF
Lab: 0
Lab: 0.1
Lab: 0.5
Lab: 0.6 — ABNORMAL LOW
Lab: 0.7
Lab: 1.7
Lab: 1.9
Lab: 10 — ABNORMAL HIGH
Lab: 10 — ABNORMAL LOW
Lab: 15 — ABNORMAL HIGH
Lab: 18 — ABNORMAL HIGH
Lab: 18 — ABNORMAL LOW
Lab: 244
Lab: 25 — ABNORMAL LOW
Lab: 31
Lab: 33 — ABNORMAL LOW
Lab: 80 — ABNORMAL LOW
Lab: 3 — ABNORMAL LOW

## 2018-10-17 LAB — COMPREHENSIVE METABOLIC PANEL
Lab: 0.2
Lab: 3.1 — ABNORMAL LOW
Lab: 92
Lab: 1.1 — ABNORMAL HIGH
Lab: 106
Lab: 143
Lab: 29 — ABNORMAL HIGH
Lab: 32 — ABNORMAL HIGH
Lab: 4.5 (ref 0–5)
Lab: 7
Lab: 8.7

## 2018-10-22 ENCOUNTER — Encounter: Admit: 2018-10-22 | Discharge: 2018-10-22 | Payer: MEDICARE

## 2018-10-22 DIAGNOSIS — G35 Multiple sclerosis: Secondary | ICD-10-CM

## 2018-10-22 MED ORDER — BETASERON 0.3 MG SC KIT
5 refills | Status: DC
Start: 2018-10-22 — End: 2019-04-11

## 2018-10-22 NOTE — Telephone Encounter
Fax from [place] that patient hs exhausted her copay assistance with speciality pharmacy, [facility] is able to send her a free monthly supply.   Rx called into [facility] at [phone] option 2 #14 + 5 refills

## 2019-03-09 ENCOUNTER — Encounter: Admit: 2019-03-09 | Discharge: 2019-03-09 | Payer: MEDICARE

## 2019-03-20 ENCOUNTER — Encounter: Admit: 2019-03-20 | Discharge: 2019-03-20 | Payer: MEDICARE

## 2019-03-20 MED ORDER — GABAPENTIN 300 MG PO CAP
ORAL_CAPSULE | Freq: Three times a day (TID) | 1 refills | Status: AC
Start: 2019-03-20 — End: ?

## 2019-03-20 NOTE — Telephone Encounter
Refill request for gabapentin, last office visit 10-12-18 long term medication refilled by Dr. Donnal Debar refills sent

## 2019-04-08 ENCOUNTER — Encounter: Admit: 2019-04-08 | Discharge: 2019-04-08 | Payer: MEDICARE

## 2019-04-11 ENCOUNTER — Encounter: Admit: 2019-04-11 | Discharge: 2019-04-11 | Payer: MEDICARE

## 2019-04-11 DIAGNOSIS — G35 Multiple sclerosis: Secondary | ICD-10-CM

## 2019-04-11 MED ORDER — BETASERON 0.3 MG SC KIT
5 refills | Status: AC
Start: 2019-04-11 — End: ?

## 2019-04-11 NOTE — Telephone Encounter
Refill request for Betaseron, last office visit 10-12-18 rx included in plan of care. Patient will be getting labs done before last office visit refills sent

## 2019-04-12 ENCOUNTER — Encounter: Admit: 2019-04-12 | Discharge: 2019-04-12 | Payer: MEDICARE

## 2019-05-31 ENCOUNTER — Encounter: Admit: 2019-05-31 | Discharge: 2019-05-31 | Payer: MEDICARE

## 2019-05-31 ENCOUNTER — Ambulatory Visit: Admit: 2019-05-31 | Discharge: 2019-06-01 | Payer: 59

## 2019-05-31 DIAGNOSIS — N319 Neuromuscular dysfunction of bladder, unspecified: Secondary | ICD-10-CM

## 2019-05-31 DIAGNOSIS — H269 Unspecified cataract: Secondary | ICD-10-CM

## 2019-05-31 DIAGNOSIS — G35 Multiple sclerosis: Principal | ICD-10-CM

## 2019-05-31 DIAGNOSIS — F329 Major depressive disorder, single episode, unspecified: Secondary | ICD-10-CM

## 2019-05-31 DIAGNOSIS — G4700 Insomnia, unspecified: Secondary | ICD-10-CM

## 2019-05-31 DIAGNOSIS — M26609 Unspecified temporomandibular joint disorder, unspecified side: Secondary | ICD-10-CM

## 2019-06-03 ENCOUNTER — Encounter: Admit: 2019-06-03 | Discharge: 2019-06-03 | Payer: MEDICARE

## 2019-06-03 DIAGNOSIS — M26609 Unspecified temporomandibular joint disorder, unspecified side: Secondary | ICD-10-CM

## 2019-06-03 DIAGNOSIS — G35 Multiple sclerosis: Secondary | ICD-10-CM

## 2019-06-03 DIAGNOSIS — F329 Major depressive disorder, single episode, unspecified: Secondary | ICD-10-CM

## 2019-06-03 DIAGNOSIS — N319 Neuromuscular dysfunction of bladder, unspecified: Secondary | ICD-10-CM

## 2019-06-03 DIAGNOSIS — H269 Unspecified cataract: Secondary | ICD-10-CM

## 2019-06-03 DIAGNOSIS — G4700 Insomnia, unspecified: Secondary | ICD-10-CM

## 2019-06-05 ENCOUNTER — Encounter: Admit: 2019-06-05 | Discharge: 2019-06-05 | Payer: MEDICARE

## 2019-06-19 ENCOUNTER — Encounter: Admit: 2019-06-19 | Discharge: 2019-06-19 | Payer: MEDICARE

## 2019-06-19 MED ORDER — FLUOXETINE 20 MG PO CAP
20 mg | ORAL_CAPSULE | Freq: Every day | ORAL | 1 refills | Status: AC
Start: 2019-06-19 — End: ?

## 2019-06-19 MED ORDER — BACLOFEN 10 MG PO TAB
10 mg | ORAL_TABLET | Freq: Three times a day (TID) | ORAL | 1 refills | Status: AC
Start: 2019-06-19 — End: ?

## 2019-06-19 MED ORDER — OXYBUTYNIN CHLORIDE 5 MG PO TAB
ORAL_TABLET | Freq: Three times a day (TID) | 0 refills | 30.00000 days | Status: AC
Start: 2019-06-19 — End: ?

## 2019-06-19 NOTE — Telephone Encounter
Refill request for oxybutynin, baclofen fluoxetine, last office visit 05-31-19 refills sent

## 2019-07-11 ENCOUNTER — Encounter: Admit: 2019-07-11 | Discharge: 2019-07-11 | Payer: MEDICARE

## 2019-09-16 ENCOUNTER — Encounter: Admit: 2019-09-16 | Discharge: 2019-09-16 | Payer: MEDICARE

## 2019-09-23 ENCOUNTER — Encounter: Admit: 2019-09-23 | Discharge: 2019-09-23 | Payer: MEDICARE

## 2019-09-23 MED ORDER — OXYBUTYNIN CHLORIDE 5 MG PO TAB
ORAL_TABLET | Freq: Three times a day (TID) | 1 refills | 30.00000 days | Status: AC
Start: 2019-09-23 — End: ?

## 2019-09-23 NOTE — Telephone Encounter
Refill request for oxybutynin, last office visit 05-31-19 rx included in plan of care. Refills sent

## 2019-09-30 ENCOUNTER — Encounter: Admit: 2019-09-30 | Discharge: 2019-09-30 | Payer: MEDICARE

## 2019-09-30 MED ORDER — BETASERON 0.3 MG SC KIT
5 refills | Status: AC
Start: 2019-09-30 — End: ?

## 2019-09-30 NOTE — Telephone Encounter
Refill request for betaseron, last office visit 05-31-19 rx included in plan of care, labs up to date. Refills sent

## 2019-10-10 ENCOUNTER — Encounter: Admit: 2019-10-10 | Discharge: 2019-10-10 | Payer: MEDICARE

## 2019-10-10 MED ORDER — GABAPENTIN 300 MG PO CAP
ORAL_CAPSULE | Freq: Three times a day (TID) | 1 refills | Status: AC
Start: 2019-10-10 — End: ?

## 2019-10-10 NOTE — Telephone Encounter
Refill request for gabapentin, last office visit 05-31-19 rx included in plan of care, refills sent

## 2019-10-22 ENCOUNTER — Encounter: Admit: 2019-10-22 | Discharge: 2019-10-22 | Payer: MEDICARE

## 2019-10-22 NOTE — Telephone Encounter
Fax from [place], patient has exhausted her co pay assistance for Betaseron. [facility] is able to send her free monthly supply if they receive a rx. Fax: [phone]. Rx for Betaseron called into [facility] at [phone] option 3 for 1 month with 3 refills.

## 2019-11-13 ENCOUNTER — Encounter: Admit: 2019-11-13 | Discharge: 2019-11-13 | Payer: MEDICARE

## 2019-11-13 DIAGNOSIS — Z79899 Other long term (current) drug therapy: Secondary | ICD-10-CM

## 2019-11-13 DIAGNOSIS — G35 Multiple sclerosis: Secondary | ICD-10-CM

## 2019-11-13 NOTE — Progress Notes
Orders faxed to [facility] lab at [phone]

## 2019-11-19 ENCOUNTER — Encounter: Admit: 2019-11-19 | Discharge: 2019-11-19 | Payer: MEDICARE

## 2019-11-19 DIAGNOSIS — Z79899 Other long term (current) drug therapy: Secondary | ICD-10-CM

## 2019-11-19 DIAGNOSIS — G35 Multiple sclerosis: Secondary | ICD-10-CM

## 2019-12-09 ENCOUNTER — Encounter: Admit: 2019-12-09 | Discharge: 2019-12-09 | Payer: MEDICARE

## 2019-12-09 ENCOUNTER — Ambulatory Visit: Admit: 2019-12-09 | Discharge: 2019-12-10 | Payer: 59

## 2019-12-09 DIAGNOSIS — N319 Neuromuscular dysfunction of bladder, unspecified: Secondary | ICD-10-CM

## 2019-12-09 DIAGNOSIS — G35 Multiple sclerosis: Secondary | ICD-10-CM

## 2019-12-09 DIAGNOSIS — G4700 Insomnia, unspecified: Secondary | ICD-10-CM

## 2019-12-09 DIAGNOSIS — F32A Depression: Secondary | ICD-10-CM

## 2019-12-09 DIAGNOSIS — H269 Unspecified cataract: Secondary | ICD-10-CM

## 2019-12-09 DIAGNOSIS — M26609 Unspecified temporomandibular joint disorder, unspecified side: Secondary | ICD-10-CM

## 2019-12-09 NOTE — Progress Notes
Date of Service: 12/09/2019    Subjective:             Leslie Hawkins is a 59 y.o. female.    History of Present Illness  She has episodes where she sees a birght light behind her eyes. Can last hours or days at a time. t hasn't happened for awhile.    She has had some hand cramps- hand will get stuck in a flexion posture. It doesn't happen very often.   She has continued to have some episodes of falling, but hasn't had any injuries lately. Uses her cane most of the time. Uses it consistently outside hte house, but not so much outside the house.   Takes betaseron faithfully and reports no problems with the medication.  Takes gabapentin for pain. Dose is good at this time.          Review of Systems   Eyes: Positive for visual disturbance (sensation of bright light behind vision).   Musculoskeletal: Positive for myalgias (hands).   All other systems reviewed and are negative.        Objective:         ? ascorbic acid (VITAMIN C) 500 mg tablet Take 500 mg by mouth twice daily.   ? baclofen (LIORESAL) 10 mg tablet Take one tablet by mouth three times daily.   ? brimonidine(+) (ALPHAGAN P) 0.15 % ophthalmic solution Apply 1 drop to both eyes twice daily.   ? calcium carbonate (TUMS) 500 mg (200 mg elemental calcium) chewable tablet Chew 2 tablets by mouth twice daily as needed. Extra Strength.    ? Cholecalciferol (Vitamin D3) 2,000 unit cap Take 1 capsule by mouth daily.   ? docusate (COLACE) 100 mg capsule Take one capsule by mouth daily.   ? fish oil /omega-3 fatty acids (SEA-OMEGA) 340/1000 mg capsule Take 2 capsules by mouth daily.   ? FLUoxetine (PROZAC) 20 mg capsule Take one capsule by mouth daily.   ? gabapentin (NEURONTIN) 300 mg capsule TAKE 1 CAPSULE BY MOUTH THREE TIMES DAILY   ? interferon beta-1b (BETASERON) 0.3 mg/1.2 mL injection KIT Inject 1ml under the skin every 48 hours.   ? lisinopriL (ZESTRIL) 40 mg tablet Take 40 mg by mouth daily.   ? melatonin 3 mg tab Take 4 mg by mouth at bedtime daily.   ? Miscellaneous Medical Supply misc Provide pt with padding adjustment with new velcro for R AFO brace.   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 Gara Kroner, NPI: [id number]   ? Miscellaneous Medical Supply misc Straight in/out catheters 14 French 8-10 times per day  Medical Necessity   Multiple Sclerosis   G35   ? Multivitamin Cmb No.21-Iron-FA (CENTRUM) 18-400 mg-mcg tab Take 1 tablet by mouth daily.   ? oxybutynin chloride (DITROPAN) 5 mg tablet TAKE 1 TABLET BY MOUTH THREE TIMES DAILY     Vitals:    12/09/19 1249   BP: (!) 181/92   BP Source: Arm, Left Upper   Patient Position: Sitting   Pulse: 50   Weight: 55.4 kg (122 lb 2.2 oz)   Height: 172.7 cm (68)   PainSc: Zero     Body mass index is 18.57 kg/m?Marland Kitchen     Physical Exam  Depression Screening was performed on Leslie Hawkins in clinic today. Based on the score of 0, no follow up action or recommendations are necessary at this time.  Examination    Mental Status Exam: Patient is alert  and oriented in all four spheres. There is normal short and long term memory. Language functions are normal both for comprehension and expression.  Speech and Language: nl  HEENT: Normal.  Extremities:   Cranial Nerve Exam:   Cranial Nerve II Right Left   Visual Acuity 20/25 glasses 20/25 glasses   Pupil     Visual Fields     Fundoscopic     Color Vision       Cranial Nerves III-XII Right Left   III, IV, VI (EOM's) nl nl   V nl nl   VII nl nl   VIII nl nl   IX, X nl nl   XI nl nl   XII nl nl     Nystagmus: None    Motor:    R L   R L   Neck flexors    Hip flexors 4 5   Neck extensors    Hip abductors     Shoulder Abductors 5 5  Hip extensors 5 5   Elbow flexors 5 5  Hip adductors     Elbow extensors 5 5  Knee flexors 5 5   Wrist flexors 5 5  Knee extensors 5 5   Wrist extensors 5 5  Ankle dorsiflexors brace 5   Finger flexors 5 5  Ankle plantar flexors brace 5   Finger abductors 5 5  Ankle inversion      5 5  Ankle eversion         Toe flexors         Toe extensors Bulk and Tone:    Upper Extremity R L   Atrophy No No   Increased Tone No No   Fasciculations No No     Lower Extremity R L   Atrophy No No   Increased Tone No No   Fasciculations No No     Cerebellar/Fine Motor R L   Finger nose finger Normal Normal   Heel to shin     Finger tapping     Foot tapping     Rapid alternating movements Normal Normal     Gait: mildly ataxic with cane and right AFO  Ambulation Index: 8.18  Romberg:     9 hole peg  LH 27.78  RH 24.87           Assessment and Plan:    Problem   Ms (Multiple Sclerosis) (Hcc)    Multiple Sclerosis Subtype: Secondary progressive   Symptom onset: 1992  Date of Diagnosis: 1992  Prior Medication failures: Avonex, Copaxone, Imuran, NBI 5788  Current DMD: Betaseron   Last MRI 2008- not available here              MS (multiple sclerosis) (HCC)  She is clinically stable on Betaseron. However, I have not seen an MRI in a long time. I would like to get a new MRI to look for changes over time.   Continue Betaseron  CBC, CMP were reviewed and are okay.   Her cane was too short, and I raised it up for her.

## 2020-01-17 ENCOUNTER — Encounter: Admit: 2020-01-17 | Discharge: 2020-01-17 | Payer: MEDICARE

## 2020-01-17 DIAGNOSIS — G35 Multiple sclerosis: Secondary | ICD-10-CM

## 2020-01-17 MED ORDER — BETASERON 0.3 MG SC KIT
5 refills | Status: AC
Start: 2020-01-17 — End: ?

## 2020-03-10 ENCOUNTER — Encounter: Admit: 2020-03-10 | Discharge: 2020-03-10 | Payer: MEDICARE

## 2020-03-10 MED ORDER — GABAPENTIN 300 MG PO CAP
ORAL_CAPSULE | Freq: Three times a day (TID) | 1 refills | Status: AC
Start: 2020-03-10 — End: ?

## 2020-03-10 MED ORDER — BACLOFEN 10 MG PO TAB
ORAL_TABLET | Freq: Three times a day (TID) | 1 refills | Status: AC
Start: 2020-03-10 — End: ?

## 2020-03-10 MED ORDER — FLUOXETINE 20 MG PO CAP
20 mg | ORAL_CAPSULE | Freq: Every day | ORAL | 1 refills | Status: AC
Start: 2020-03-10 — End: ?

## 2020-03-10 MED ORDER — OXYBUTYNIN CHLORIDE 5 MG PO TAB
ORAL_TABLET | Freq: Three times a day (TID) | 1 refills | 30.00000 days | Status: AC
Start: 2020-03-10 — End: ?

## 2020-03-10 NOTE — Telephone Encounter
Refill request for oxybutynin, gabapentin, baclofen and fluoxetine. Last office visit 12-09-19 refills sent

## 2020-03-17 ENCOUNTER — Encounter: Admit: 2020-03-17 | Discharge: 2020-03-17 | Payer: MEDICARE

## 2020-05-04 ENCOUNTER — Encounter: Admit: 2020-05-04 | Discharge: 2020-05-04 | Payer: MEDICARE

## 2020-05-15 ENCOUNTER — Encounter: Admit: 2020-05-15 | Discharge: 2020-05-15 | Payer: MEDICARE

## 2020-06-23 ENCOUNTER — Encounter: Admit: 2020-06-23 | Discharge: 2020-06-23 | Payer: MEDICARE

## 2020-06-23 DIAGNOSIS — G35 Multiple sclerosis: Secondary | ICD-10-CM

## 2020-06-23 MED ORDER — BETASERON 0.3 MG SC KIT
5 refills | Status: AC
Start: 2020-06-23 — End: ?

## 2020-06-23 NOTE — Telephone Encounter
Refill request or betaseron, last office visit 12-08-20 rx included in plan of are. Refills sent

## 2020-07-03 ENCOUNTER — Encounter: Admit: 2020-07-03 | Discharge: 2020-07-03 | Payer: MEDICARE

## 2020-07-06 ENCOUNTER — Encounter: Admit: 2020-07-06 | Discharge: 2020-07-06 | Payer: 59

## 2020-07-10 ENCOUNTER — Encounter: Admit: 2020-07-10 | Discharge: 2020-07-10 | Payer: 59

## 2020-07-10 ENCOUNTER — Ambulatory Visit: Admit: 2020-07-10 | Discharge: 2020-07-10 | Payer: 59

## 2020-07-10 DIAGNOSIS — G35 Multiple sclerosis: Secondary | ICD-10-CM

## 2020-07-10 DIAGNOSIS — D329 Benign neoplasm of meninges, unspecified: Secondary | ICD-10-CM

## 2020-07-10 DIAGNOSIS — N319 Neuromuscular dysfunction of bladder, unspecified: Secondary | ICD-10-CM

## 2020-07-10 DIAGNOSIS — G4700 Insomnia, unspecified: Secondary | ICD-10-CM

## 2020-07-10 DIAGNOSIS — M26609 Unspecified temporomandibular joint disorder, unspecified side: Secondary | ICD-10-CM

## 2020-07-10 DIAGNOSIS — F32A Depression: Secondary | ICD-10-CM

## 2020-07-10 DIAGNOSIS — H269 Unspecified cataract: Secondary | ICD-10-CM

## 2020-07-11 ENCOUNTER — Encounter: Admit: 2020-07-11 | Discharge: 2020-07-11 | Payer: 59

## 2020-07-14 ENCOUNTER — Encounter: Admit: 2020-07-14 | Discharge: 2020-07-14 | Payer: 59

## 2020-07-14 DIAGNOSIS — N319 Neuromuscular dysfunction of bladder, unspecified: Secondary | ICD-10-CM

## 2020-07-14 DIAGNOSIS — F32A Depression: Secondary | ICD-10-CM

## 2020-07-14 DIAGNOSIS — G35 Multiple sclerosis: Secondary | ICD-10-CM

## 2020-07-14 DIAGNOSIS — M26609 Unspecified temporomandibular joint disorder, unspecified side: Secondary | ICD-10-CM

## 2020-07-14 DIAGNOSIS — H269 Unspecified cataract: Secondary | ICD-10-CM

## 2020-07-14 DIAGNOSIS — G4700 Insomnia, unspecified: Secondary | ICD-10-CM

## 2020-07-16 ENCOUNTER — Encounter: Admit: 2020-07-16 | Discharge: 2020-07-16 | Payer: 59

## 2020-07-16 DIAGNOSIS — G35 Multiple sclerosis: Secondary | ICD-10-CM

## 2020-07-16 DIAGNOSIS — Z79899 Other long term (current) drug therapy: Secondary | ICD-10-CM

## 2020-07-27 ENCOUNTER — Encounter: Admit: 2020-07-27 | Discharge: 2020-07-27 | Payer: 59

## 2020-07-27 NOTE — Telephone Encounter
Patient states she has an appointment with Dr. Enrique Sack on 08/03/20. Reports contacting covid on 07/23/20 and is calling to ask about our clinic protocol. Advised patient she is able to come in after isolating for 5 days and is fever free for 24 hours. Patient voiced understanding.

## 2020-08-03 ENCOUNTER — Ambulatory Visit: Admit: 2020-08-03 | Discharge: 2020-08-03 | Payer: 59

## 2020-08-03 ENCOUNTER — Encounter: Admit: 2020-08-03 | Discharge: 2020-08-03 | Payer: 59

## 2020-08-03 DIAGNOSIS — M26609 Unspecified temporomandibular joint disorder, unspecified side: Secondary | ICD-10-CM

## 2020-08-03 DIAGNOSIS — D329 Benign neoplasm of meninges, unspecified: Secondary | ICD-10-CM

## 2020-08-03 DIAGNOSIS — N319 Neuromuscular dysfunction of bladder, unspecified: Secondary | ICD-10-CM

## 2020-08-03 DIAGNOSIS — H269 Unspecified cataract: Secondary | ICD-10-CM

## 2020-08-03 DIAGNOSIS — G35 Multiple sclerosis: Secondary | ICD-10-CM

## 2020-08-03 DIAGNOSIS — G4700 Insomnia, unspecified: Secondary | ICD-10-CM

## 2020-08-03 DIAGNOSIS — F32A Depression: Secondary | ICD-10-CM

## 2020-08-03 MED ORDER — GADOBENATE DIMEGLUMINE 529 MG/ML (0.1MMOL/0.2ML) IV SOLN
11 mL | Freq: Once | INTRAVENOUS | 0 refills | Status: CP
Start: 2020-08-03 — End: ?
  Administered 2020-08-03: 17:00:00 11 mL via INTRAVENOUS

## 2020-08-03 NOTE — Patient Instructions
A referral for radiation oncology has been placed. Their team should be reaching out for scheduling, if they should not- you may call [phone] to check the status.     For Neurosurgical questions or concerns, please use every attempt to send a My Chart message and your message will be directed to the appropriate neurosurgery staff member.   If My Chart is not an option please call Shawna Orleans, RN for Dr. Francis Gaines at [phone].    Thank you for allowing [place] to participate in your care.      Preventing the Spread of Infection  ?  Hand Hygiene  The best way to prevent the spread of infections is to wash your hands or use hand sanitizer. Staff will clean hands between tasks and upon entering and exiting your hospital room.?  For patients and visitors:  Clean hands frequently, upon entering and exiting a room, and after coughing and sneezing.   When washing hands with soap and water:  ? Wet hands with warm water  ? Apply soap  ? Lather soap by rubbing hands together for 20 seconds, covering all surfaces of hands and fingers  ? Rinse hands thoroughly  ? Dry hands with paper towel  ? Use a towel to turn off faucet  ?  When cleaning hands with hand sanitizer:  ? Apply hand sanitizer to hands  ? Rub on hands covering of hands and fingers until dry (about 15 to 20 seconds)?  ?  Isolation Precautions  Isolation is used to help stop the spread of germs.  When the patient has been placed on Isolation Precautions, there will be a sign on the patient?s door. The sign will show what type of Isolation Precautions are needed.?   ? All staff will wear the isolation gear listed on the sign to protect themselves and others.  ? Visitors should wear isolation gear, as noted on the sign, to protect themselves and others. Check with nurse if you have any questions.  ?  Different Types of Isolation Precautions:   Airborne Isolation Precautions  ? The patient may have an infection, such as tuberculosis or measles  ? These are very small germs that are spread through the air from one person to another. These germs may float or hang in the air. ?  ? The patient?s door must stay closed. The room has special airflow to prevent the spread of germs.  ? The visitor will wear a?surgical mask while in the room  ? Staff will wear a respirator while taking care of the patient.  ?  Contact Precautions  ? The patient may have an infection, such as MRSA, VRE, open wounds, RSV  ? These germs can spread when people touch the patient or the items in your room.  ? Staff will wear gowns and gloves while in the room.  ? Visitors should not touch dressings, tubes, bed sheets and other items the patient may touch.  ? Visitors should wear gown and gloves while in the room.  ?  Contact Plus Precautions  ? The patient may have an infection, such as infectious diarrhea (C. difficile or Norovirus)  ? These germs that can spread when people touch the patient or the items in your room.  ? Staff and visitors follow the same precautions as Contact Precautions and are to wash hands with soap and water.  ? Staff will also use bleach to clean equipment.?  ? Visitors should not use the bathroom in the patient?s room and  wash hands with soap and water when exiting the room.?  ?  Droplet Precautions  ? The patient may have an infection, such as flu, whooping cough, bacterial meningitis  ? These germs spread in tiny droplets caused by speaking, coughing and sneezing  ? Patients should stay in the room. If they need to leave the room, they must wear a mask.  ? Staff will wear a mask when caring for the patient.  ? Eye protection may be worn in addition to droplet precautions  ? Visitors should wear a mask while in the room.  ?  03-19-2018?

## 2020-08-03 NOTE — Progress Notes
Date of Service: 08/03/2020    Subjective:             Leslie Hawkins is a 60 y.o. female.    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  Patient was diagnosed with MS in 1992.  She is currently being followed closely by Dr Burnadette Peter. Clinically she is doing well.  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  Clinically she denies any symptom related to this meningioma.  I reviewed this MRI and compared it to previous MRIs done in 2019, 2016 and 2008.  It is clear that the meningioma had significantly increased in size in the past few years.       Review of Systems   All other systems reviewed and are negative.    Medical History:   Diagnosis Date   ? Cataract     diplopia-for cataract surgery   ? Depression    ? Insomnia    ? 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 Secondary progressive multiple sclerosis (HCC)     gradually worsening,dysarthria ; RRMS   ? TMJ (temporomandibular joint disorder)      Surgical History:   Procedure Laterality Date   ? HYSTERECTOMY  2007   ? CATARACT REMOVAL  2011   ? EXPLORATORY LAPAROTOMY WITHOUT BIOPSY, ILEALCOLECTOMY, WOUND VAC PLACEMENT N/A 09/17/2017    Performed by Guinevere Scarlet, MD at [facility] [place]   ? REOPENING RECENT LAPAROTOMY, ILEOCOLONIC ANASTAMOSIS, ABDOMINAL WASHOUT, COLON RESECTION, FASCIAL CLOSURE N/A 09/19/2017    Performed by Ronn Melena., MD at [facility] [place]   ? PARTIAL HIP ARTHROPLASTY  02/2019     Family History   Problem Relation Age of Onset   ? Neuropathy Father    ? Multiple sclerosis Neg Hx      Social History     Socioeconomic History   ? Marital status: Married   Tobacco Use   ? Smoking status: Never Smoker   ? Smokeless tobacco: Never Used   Substance and Sexual Activity   ? Alcohol use: Yes     Comment: Occassional    ? Drug use: No                   Objective:         ? ascorbic acid (VITAMIN C) 500 mg tablet Take 500 mg by mouth twice daily.   ? baclofen (LIORESAL) 10 mg tablet TAKE 1 TABLET BY MOUTH THREE TIMES DAILY   ? brimonidine (ALPHAGAN P) 0.15 % ophthalmic solution Apply 1 drop to both eyes twice daily.   ? calcium carbonate (TUMS) 500 mg (200 mg elemental calcium) chewable tablet Chew 2 tablets by mouth twice daily as needed. Extra Strength.    ? Cholecalciferol (Vitamin D3) 2,000 unit cap Take 1 capsule by mouth daily.   ? docusate (COLACE) 100 mg capsule Take one capsule by mouth daily.   ? fish oil /omega-3 fatty acids (SEA-OMEGA) 340/1000 mg capsule Take 2 capsules by mouth daily.   ? FLUoxetine (PROZAC) 20 mg capsule Take 1 capsule by mouth once daily   ? gabapentin (NEURONTIN) 300 mg capsule TAKE 1 CAPSULE BY MOUTH THREE TIMES DAILY   ? interferon beta-1b (BETASERON) 0.3 mg/1.2 mL injection KIT INJECT 1 ML UNDER THE  SKIN EVERY 48 HOURS   ? lisinopriL (ZESTRIL) 40 mg tablet Take 40 mg by mouth daily.   ? melatonin 3 mg tab Take 5 mg by mouth at bedtime daily.   ? Miscellaneous Medical Supply misc Provide pt with padding adjustment with new velcro for R AFO brace.   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 Gara Kroner, NPI: [id number]   ? Miscellaneous Medical Supply misc Straight in/out catheters 14 French 8-10 times per day  Medical Necessity   Multiple Sclerosis   G35   ? multivitamin cmb No.21-iron-folic acid (CENTRUM) 18-400 mg-mcg tablet Take 1 tablet by mouth daily.   ? other medication Place  into or around eye(s). Retina specialist- injection in eye   ? oxybutynin chloride (DITROPAN) 5 mg tablet TAKE 1 TABLET BY MOUTH THREE TIMES DAILY     Vitals:    08/03/20 1416   BP: (!) 150/66   Pulse: 59   SpO2: 100%   PainSc: Zero   Weight: 56.4 kg (124 lb 4.8 oz)   Height: 172.7 cm (5' 8)     Body mass index is 18.9 kg/m?Marland Kitchen     Physical Exam  Constitutional:       Appearance: Normal appearance.   HENT:      Head: Normocephalic and atraumatic.   Eyes:      Extraocular Movements: Extraocular movements intact.      Conjunctiva/sclera: Conjunctivae normal.      Pupils: Pupils are equal, round, and reactive to light.   Neurological:      Mental Status: She is alert and oriented to person, place, and time.      Cranial Nerves: Cranial nerves are intact.      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  I discussed the condition with her and answered her questions.  By comparing this MRI to the 2019 MRI, It is clear that the meningioma had significantly increased in size in the past few years.  I discussed with her the different treatment options, including observation, surgery and radiation  We discussed the pros and cons of every approach.  I personally recommended radiation therapy.  I will refer her to rad onc for that.

## 2020-08-20 ENCOUNTER — Ambulatory Visit: Admit: 2020-08-20 | Discharge: 2020-08-20 | Payer: 59

## 2020-08-20 DIAGNOSIS — D329 Benign neoplasm of meninges, unspecified: Secondary | ICD-10-CM

## 2020-08-21 ENCOUNTER — Encounter: Admit: 2020-08-21 | Discharge: 2020-08-21 | Payer: 59

## 2020-08-27 ENCOUNTER — Ambulatory Visit: Admit: 2020-08-27 | Discharge: 2020-08-27 | Payer: 59

## 2020-08-27 ENCOUNTER — Encounter: Admit: 2020-08-27 | Discharge: 2020-08-27 | Payer: 59

## 2020-08-27 MED ORDER — PROMETHAZINE 12.5 MG PO TAB
12.5 mg | ORAL_TABLET | Freq: Four times a day (QID) | ORAL | 0 refills | 8.00000 days | Status: AC
Start: 2020-08-27 — End: ?

## 2020-08-27 NOTE — Progress Notes
Continuum of Care Case Management Note        Radiation Oncology    Plan:   Spoke with patient for new patient visit.    Interventions:  Spoke with patient and her spouse, Gwyndolyn Saxon who prefers to be called Harrington Challenger, for new patient visit.  Provided business card and introduced the social work role on the Radiation Oncology team.  Patient has a diagnosis of MS and mangioma and has been referred for radiation treatment.      Patient lives in [place], [place], about 90 minutes from [place].  Discussed lodging resources. Pt will likely travel back and forth from home to radiation daily. Pt scheduled to receive 5 treatments starting 09/07/20.  Provided form for Advance Directive/DPOA and discussed that SW can assist with notarizing.      Patient has insurance through [employer].     Pt and spouse deny barriers to radiation treatment.       Carloyn Jaeger, LSCSW

## 2020-08-31 ENCOUNTER — Encounter: Admit: 2020-08-31 | Discharge: 2020-08-31 | Payer: 59

## 2020-08-31 DIAGNOSIS — D329 Benign neoplasm of meninges, unspecified: Secondary | ICD-10-CM

## 2020-09-02 ENCOUNTER — Encounter: Admit: 2020-09-02 | Discharge: 2020-09-02 | Payer: 59

## 2020-09-04 ENCOUNTER — Ambulatory Visit: Admit: 2020-09-04 | Discharge: 2020-09-04 | Payer: 59

## 2020-09-07 ENCOUNTER — Ambulatory Visit: Admit: 2020-09-07 | Discharge: 2020-09-07 | Payer: 59

## 2020-09-07 ENCOUNTER — Encounter: Admit: 2020-09-07 | Discharge: 2020-09-07 | Payer: 59

## 2020-09-07 DIAGNOSIS — D329 Benign neoplasm of meninges, unspecified: Secondary | ICD-10-CM

## 2020-09-07 NOTE — Procedures
Patient: Leslie Hawkins    Medical record number: [id number]    Procedure: MRI and CT directed stereotactic radiotherapy (first session) to treat a complex skull base meningioma utilizing the stereotactic mask fixation system    Date of procedure: September 07, 2020    Surgeon: Artis Delay, MD    Assistant surgeon: No resident physician available    Clinical summary: Ms. Ertle is a very nice 60 y.o. 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   The risks and possible complications of this procedure have been carefully reviewed.  These include failure to control the growth of the tumor, recurrence of the tumor and the delayed risks of radiation necrosis.  She has also had extensive consultation with Dr. Ihor Austin of the Radiation Oncology department concerning the risks and possible complications of this procedure. She expresses understanding and wishes to proceed.     Description: The preplanning MRI examination of the brain was imported into the computer in the [facility] planning center.  The patient was fit with a stereotactic mask and subsequently underwent a noncontrast CT examination of the head.  These images were imported and fused with the preplanning MRI examination of the brain.  The area to be treated was targeted and the treatment plan was developed.  Based upon the volume and location of the tumor, Dr. Ihor Austin recommended stereotactic radiotherapy featuring a dose of 6 Gy to be delivered in each of 5 sessions.  After performing the final checks, the treatment plan was approved by the staff medical physicist, by Dr. Ihor Austin and by me.  The plan was then imported into the computer in the [facility] treatment center.  On the day of treatment, the patient reclined on the treatment table and all pressure points were carefully padded. Her head was immobilized using the stereotactic mask fixation system.  Patient positioning was verified with ExacTrac and cone beam CT. After performing a final visual check, the treatment was delivered.  The plan called for the delivery of radiation utilizing 4 arcs.    Immediately upon completion of the treatment, the stereotactic mask was removed.  The patient had no new neurologic deficit as a result of the treatment today and there were no complications.  At the completion of the treatment, she was escorted back to the holding area and was given appropriate discharge and follow-up instructions.  I was present throughout the course the treatment today.

## 2020-09-08 ENCOUNTER — Ambulatory Visit: Admit: 2020-09-08 | Discharge: 2020-09-08 | Payer: 59

## 2020-09-08 ENCOUNTER — Encounter: Admit: 2020-09-08 | Discharge: 2020-09-08 | Payer: 59

## 2020-09-09 ENCOUNTER — Ambulatory Visit: Admit: 2020-09-09 | Discharge: 2020-09-09 | Payer: 59

## 2020-09-09 ENCOUNTER — Encounter: Admit: 2020-09-09 | Discharge: 2020-09-09 | Payer: 59

## 2020-09-10 ENCOUNTER — Encounter: Admit: 2020-09-10 | Discharge: 2020-09-10 | Payer: 59

## 2020-09-10 ENCOUNTER — Ambulatory Visit: Admit: 2020-09-10 | Discharge: 2020-09-10 | Payer: 59

## 2020-09-10 DIAGNOSIS — M26609 Unspecified temporomandibular joint disorder, unspecified side: Secondary | ICD-10-CM

## 2020-09-10 DIAGNOSIS — N319 Neuromuscular dysfunction of bladder, unspecified: Secondary | ICD-10-CM

## 2020-09-10 DIAGNOSIS — D329 Benign neoplasm of meninges, unspecified: Secondary | ICD-10-CM

## 2020-09-10 DIAGNOSIS — G4700 Insomnia, unspecified: Secondary | ICD-10-CM

## 2020-09-10 DIAGNOSIS — G35 Multiple sclerosis: Secondary | ICD-10-CM

## 2020-09-10 DIAGNOSIS — F32A Depression: Secondary | ICD-10-CM

## 2020-09-10 DIAGNOSIS — H269 Unspecified cataract: Secondary | ICD-10-CM

## 2020-09-11 ENCOUNTER — Encounter: Admit: 2020-09-11 | Discharge: 2020-09-11 | Payer: 59

## 2020-09-11 ENCOUNTER — Ambulatory Visit: Admit: 2020-09-11 | Discharge: 2020-09-11 | Payer: 59

## 2020-09-15 ENCOUNTER — Encounter: Admit: 2020-09-15 | Discharge: 2020-09-15 | Payer: 59

## 2020-09-15 DIAGNOSIS — D329 Benign neoplasm of meninges, unspecified: Secondary | ICD-10-CM

## 2020-09-15 MED ORDER — BACLOFEN 10 MG PO TAB
ORAL_TABLET | Freq: Three times a day (TID) | 0 refills | Status: AC
Start: 2020-09-15 — End: ?

## 2020-09-15 MED ORDER — OXYBUTYNIN CHLORIDE 5 MG PO TAB
ORAL_TABLET | Freq: Three times a day (TID) | 0 refills | 30.00000 days | Status: AC
Start: 2020-09-15 — End: ?

## 2020-09-15 MED ORDER — FLUOXETINE 20 MG PO CAP
20 mg | ORAL_CAPSULE | Freq: Every day | ORAL | 0 refills | Status: AC
Start: 2020-09-15 — End: ?

## 2020-09-15 MED ORDER — GABAPENTIN 300 MG PO CAP
ORAL_CAPSULE | Freq: Three times a day (TID) | 0 refills | Status: AC
Start: 2020-09-15 — End: ?

## 2020-09-16 NOTE — Progress Notes
End of Treatment Summary Note    Date:  09/15/2020    Leslie Hawkins is a 60 y.o. female.     There were no encounter diagnoses.  Staging: Cancer Staging  No matching staging information was found for the patient.    Treatment Data Summary:   Site of treatment: left jugular tubercle meningioma  Date of treatment: 08/29 to 09/11/20  Dose /fraction: SIB 25/30 Gy in 5 fractions  Technique of radiation: Photon, 6X, IMRT/VMAT, SRT    Treatment Data 09/07/2020 09/08/2020 09/09/2020 09/10/2020 09/11/2020   Course ID C1 Brain C1 Brain C1 Brain C1 Brain C1 Brain   Plan ID Base of skull Base of skull Base of skull Base of skull Base of skull   Prescription Dose (cGy) 3,000 3,000 3,000 3,000 3,000   Prescribed Dose per Fraction (Gy) '6 6 6 6 6   '$ Fractions Treated to Date '1 2 3 4 5   '$ Total Fractions on Plan '5 5 5 5 5   '$ Treatment Elapsed Days 0 '1 2 3 4   '$ Reference Point ID Base of skull Base of skull Base of skull Base of skull Base of skull   Dosage Given to Date [id number] 12.00000002 18.00000003 24.00000004 30.00000005   Some recent data might be hidden        Number of missed treatment days: none     Treatment Tolerance and Response:  The patient tolerated treatment well without unexpected or significant acute side effects.        Follow Up:  [name] will be scheduled for a follow up with Dr. Penni Bombard  at 6 months.    Ulice Dash, MD  Radiation Oncology Resident, PGY-3  [facility]  Pager # [phone]

## 2020-10-07 ENCOUNTER — Encounter: Admit: 2020-10-07 | Discharge: 2020-10-07 | Payer: 59

## 2020-11-15 ENCOUNTER — Encounter: Admit: 2020-11-15 | Discharge: 2020-11-15 | Payer: 59

## 2020-11-15 MED ORDER — FLUOXETINE 20 MG PO CAP
20 mg | ORAL_CAPSULE | Freq: Every day | ORAL | 0 refills
Start: 2020-11-15 — End: ?

## 2020-11-15 MED ORDER — GABAPENTIN 300 MG PO CAP
ORAL_CAPSULE | Freq: Three times a day (TID) | 0 refills
Start: 2020-11-15 — End: ?

## 2020-11-15 MED ORDER — BACLOFEN 10 MG PO TAB
ORAL_TABLET | Freq: Three times a day (TID) | 0 refills
Start: 2020-11-15 — End: ?

## 2020-11-15 MED ORDER — OXYBUTYNIN CHLORIDE 5 MG PO TAB
ORAL_TABLET | Freq: Three times a day (TID) | 0 refills
Start: 2020-11-15 — End: ?

## 2020-11-18 ENCOUNTER — Encounter: Admit: 2020-11-18 | Discharge: 2020-11-18 | Payer: 59

## 2020-11-18 DIAGNOSIS — G35 Multiple sclerosis: Secondary | ICD-10-CM

## 2020-11-18 DIAGNOSIS — E559 Vitamin D deficiency, unspecified: Secondary | ICD-10-CM

## 2020-11-18 DIAGNOSIS — Z79899 Other long term (current) drug therapy: Secondary | ICD-10-CM

## 2020-11-18 NOTE — Telephone Encounter
See additional mychart message orders faxed to [phone]

## 2020-12-23 ENCOUNTER — Encounter: Admit: 2020-12-23 | Discharge: 2020-12-23 | Payer: 59

## 2020-12-23 DIAGNOSIS — G35 Multiple sclerosis: Secondary | ICD-10-CM

## 2020-12-23 MED ORDER — BETASERON 0.3 MG SC KIT
5 refills | Status: AC
Start: 2020-12-23 — End: ?

## 2020-12-23 NOTE — Telephone Encounter
Refill request for betaseron, last office visit 07-10-20 rx included in plan of care. Refills sent

## 2020-12-28 ENCOUNTER — Encounter: Admit: 2020-12-28 | Discharge: 2020-12-28 | Payer: 59

## 2020-12-28 DIAGNOSIS — G35 Multiple sclerosis: Secondary | ICD-10-CM

## 2020-12-28 DIAGNOSIS — Z79899 Other long term (current) drug therapy: Secondary | ICD-10-CM

## 2021-01-07 ENCOUNTER — Encounter: Admit: 2021-01-07 | Discharge: 2021-01-07 | Payer: 59

## 2021-01-07 DIAGNOSIS — E559 Vitamin D deficiency, unspecified: Secondary | ICD-10-CM

## 2021-01-07 DIAGNOSIS — G35 Multiple sclerosis: Secondary | ICD-10-CM

## 2021-01-18 ENCOUNTER — Encounter: Admit: 2021-01-18 | Discharge: 2021-01-18 | Payer: 59

## 2021-01-18 ENCOUNTER — Ambulatory Visit: Admit: 2021-01-18 | Discharge: 2021-01-18 | Payer: 59

## 2021-01-18 DIAGNOSIS — M26609 Unspecified temporomandibular joint disorder, unspecified side: Secondary | ICD-10-CM

## 2021-01-18 DIAGNOSIS — G4700 Insomnia, unspecified: Secondary | ICD-10-CM

## 2021-01-18 DIAGNOSIS — G35 Multiple sclerosis: Secondary | ICD-10-CM

## 2021-01-18 DIAGNOSIS — N319 Neuromuscular dysfunction of bladder, unspecified: Secondary | ICD-10-CM

## 2021-01-18 DIAGNOSIS — F32A Depression: Secondary | ICD-10-CM

## 2021-01-18 DIAGNOSIS — H269 Unspecified cataract: Secondary | ICD-10-CM

## 2021-01-18 NOTE — Progress Notes
Date of Service: 01/18/2021    Subjective:             Leslie Hawkins is a 61 y.o. female.    History of Present Illness    Since the last visit the patient has been undergoing treatments with Radiation Oncology for the meningioma and has since completed her treatments as of 09/11/20. She will have a repeat MRI in March of 2023. She has had only one fall in the past 6 months which she reports is an improvement from her weekly falls prior to the radiation. Patient continues to be compliant with her Betaseron and other medications.       Review of Systems   Eyes: Positive for visual disturbance.   All other systems reviewed and are negative.        Objective:         ? ascorbic acid (VITAMIN C) 500 mg tablet Take 500 mg by mouth twice daily.   ? baclofen (LIORESAL) 10 mg tablet TAKE 1 TABLET BY MOUTH THREE TIMES DAILY   ? BETASERON 0.3 mg injection KIT INJECT 1 ML UNDER THE SKIN EVERY 48 HOURS   ? bevacizumab (AVASTIN IV) Place 1 Dose into or around eye(s) every 90 days. Injection into Left eye q 3 mo.   ? brimonidine (ALPHAGAN P) 0.15 % ophthalmic solution Apply 1 drop to both eyes twice daily.   ? calcium carbonate (TUMS) 500 mg (200 mg elemental calcium) chewable tablet Chew 2 tablets by mouth twice daily as needed. Extra Strength.    ? Cholecalciferol (Vitamin D3) 2,000 unit cap Take 1 capsule by mouth daily.   ? docusate (COLACE) 100 mg capsule Take one capsule by mouth daily.   ? fish oil /omega-3 fatty acids (SEA-OMEGA) 340/1000 mg capsule Take 2 capsules by mouth daily.   ? FLUoxetine (PROZAC) 20 mg capsule Take 1 capsule by mouth once daily   ? gabapentin (NEURONTIN) 300 mg capsule TAKE 1 CAPSULE BY MOUTH THREE TIMES DAILY   ? melatonin 3 mg tab Take 5 mg by mouth at bedtime daily.   ? Miscellaneous Medical Supply misc Provide pt with padding adjustment with new velcro for R AFO brace.   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 Gara Kroner, NPI: [id number]   ? Miscellaneous Medical Supply misc Straight in/out catheters 14 French 8-10 times per day  Medical Necessity   Multiple Sclerosis   G35   ? multivitamin cmb No.21-iron-folic acid (CENTRUM) 18-400 mg-mcg tablet Take 1 tablet by mouth daily.   ? other medication Place  into or around eye(s). Retina specialist- injection in eye   ? oxybutynin chloride (DITROPAN) 5 mg tablet TAKE 1 TABLET BY MOUTH THREE TIMES DAILY   ? promethazine (PHENERGAN) 12.5 mg tablet Take one tablet by mouth four times daily.     Vitals:    01/18/21 1238   BP: (!) 175/96   BP Source: Arm, Left Upper   Pulse: 56   PainSc: Zero   Weight: 55.2 kg (121 lb 9.6 oz)   Height: 172.7 cm (5' 8)     Body mass index is 18.49 kg/m?Marland Kitchen     Physical Exam  Depression Screening was performed on Leslie Hawkins in clinic today. Based on the score of <3  , no follow up action or recommendations are necessary at this time.  Examination    Mental Status Exam: Patient is alert and oriented in all four spheres. There is normal short and long term  memory. Language functions are normal both for comprehension and expression.  Speech and Language: Intact and fluent  HEENT: Normal.  Extremities: purposeful movement  Cranial Nerve Exam:   Cranial Nerve II Right Left   Visual Acuity 20/25-1 glasses 20/30-1 glasses   Pupil     Visual Fields     Fundoscopic     Color Vision       Cranial Nerves III-XII Right Left   III, IV, VI (EOM's) WNL WNL   V WNL WNL   VII WNL WNL   VIII WNL WNL   IX, X WNL WNL   XI WNL WNL   XII WNL WNL     Nystagmus: None    Motor:    R L   R L   Neck flexors 5 5  Hip flexors 5 5   Neck extensors 5 5  Hip abductors 5 5   Shoulder Abductors 5 5  Hip extensors 5 5   Elbow flexors 5 5  Hip adductors 5 5   Elbow extensors 5 5  Knee flexors 5 5   Wrist flexors 5 5  Knee extensors 5 5   Wrist extensors 5 5  Ankle dorsiflexors 5 5   Finger flexors 5 5  Ankle plantar flexors 5 5   Finger abductors 5 5  Ankle inversion      5 5  Ankle eversion         Toe flexors 5 5       Toe extensors 5 5     Bulk and Tone:    Upper Extremity R L   Atrophy No No   Increased Tone No No   Fasciculations No No     Lower Extremity R L   Atrophy No No   Increased Tone No MAS 1   Fasciculations No No     Reflexes     Jaw      R L   Biceps 2+ 2+   Brachioradialis 1+ 1+   Triceps 2+ 2+   Knee 2+ 2+   Ankle 2+ 2+     Reflexes R L   Hoffman + -   Finger Flexor     Plantar - -             Sensory Examination Light Touch:  R L   Upper Extremity intact intact   Lower Extremity intact intact         Sensory Examination: Joint position sense    R L   Fingers     Toes     Ankle       Sensory examination: Vibration in Seconds    R L   Fingers     Toes 4 4   Ankle 5 5     Cerebellar/Fine Motor R L   Finger nose finger Normal Normal   Heel to shin     Finger tapping     Foot tapping     Rapid alternating movements Normal Normal     Gait: Ambulates with SPC and AFO on the right with demonstration of heel strike bilaterally.  Ambulation Index: 7.65 w. cane    9 hole peg  LH 23.69  RH 28.97    SDMT- 42       Assessment and Plan:    Problem   Ms (Multiple Sclerosis) (Hcc)    Multiple Sclerosis Subtype: Secondary progressive   Symptom onset: 1992  Date of Diagnosis: 1992  Prior Medication failures: Avonex,  Copaxone, Imuran, NBI 5788  Current DMD: Betaseron   Last MRI 2022  1. ?Numerous FLAIR hyperintense supratentorial white matter lesions and periatrial predominant cerebral volume loss, consistent with clinically reported multiple sclerosis.   2. ?Moderate diffuse cerebellar atrophy.   3. ?Small extra-axial lesion along the left jugular tubercle and foramen magnum dural reflection, likely reflecting a meningioma with mild localized mass effect. MRI brain tumor protocol (with and without contrast) is recommended on future follow-up examinations.               MS (multiple sclerosis) (HCC)  Patient symptoms continue to be stable with some improvement noted. Decreased frequency of falls after completion of radiation treatment.  Will continue Betaseron  She has a slightly lower WBC- perhaps due to the recent radiation therapy.   Will Return to clinic in 6 months      Leslie Hawkins Mis, MD  PGY-2   Physical Medicine & Rehabilitation         ATTESTATION  I personally performed the key portions of the E/M visit, discussed the case with the resident, and concur with the resident's evaluation and plan stated above. I have altered the note to better reflect my evaluation and plan.    Lynnell Jude MD  Director of [facility]

## 2021-03-18 ENCOUNTER — Encounter: Admit: 2021-03-18 | Discharge: 2021-03-18 | Payer: 59

## 2021-03-18 IMAGING — CT CT abdomen pelvis w con
2 of 3 series · 16 of 46 positions shown, 18 images · IV contrast (CONT)
Comparison: 01/07/2021 noncontrast CT abdomen/pelvis

STUDY TYPE: CT abdomen/pelvis with IV contrast
TECHNIQUE: After intravenous administration of contrast material, postcontrast CT abdomen/pelvis was performed. Coronal and sagittal reformatted images were generated.
HISTORY: Abdominal pain, decreased bowel sounds, history of ileus and obstruction

[Series 3: soft tissue w/ · axial · 0.69mm/px · z∈[-430,-30]mm · 13 of 184 slices shown, 15 images]
[im 12/184  soft-tissue]
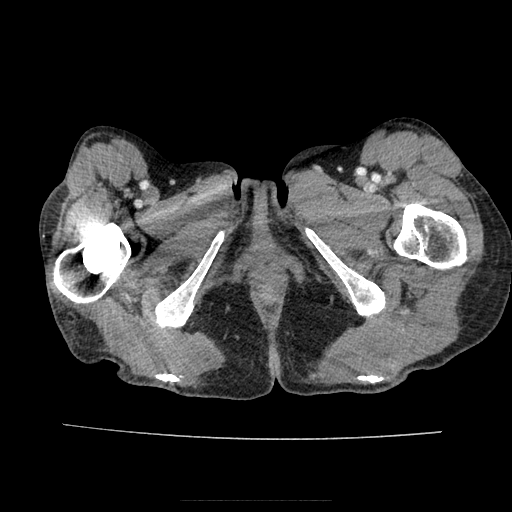
[im 12/184  bone]
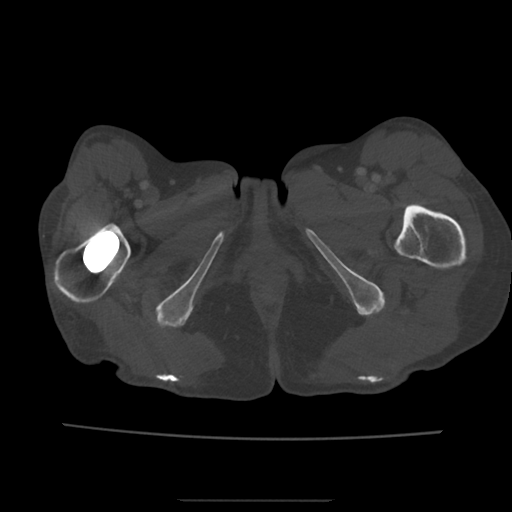
[im 24/184  soft-tissue]
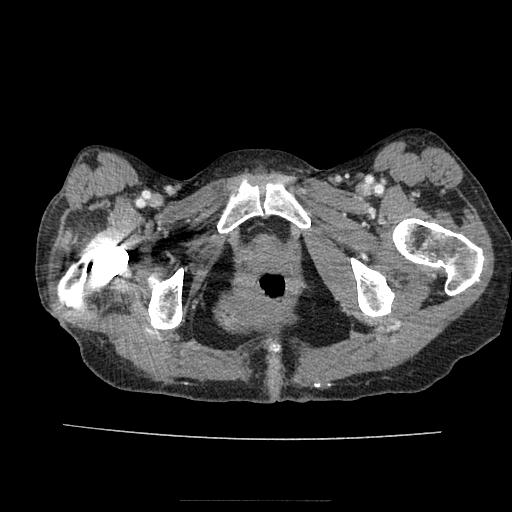
[im 36/184  soft-tissue]
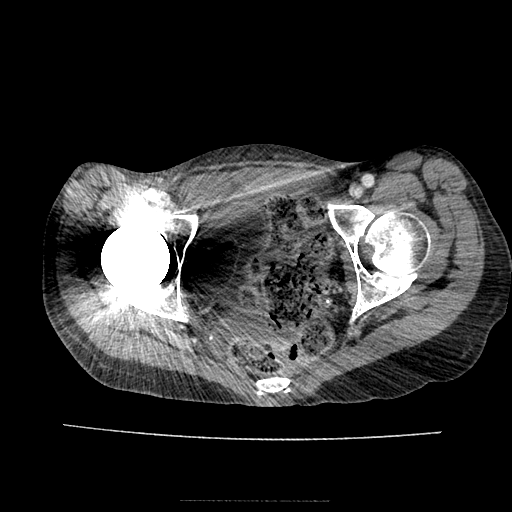
[im 54/184  soft-tissue]
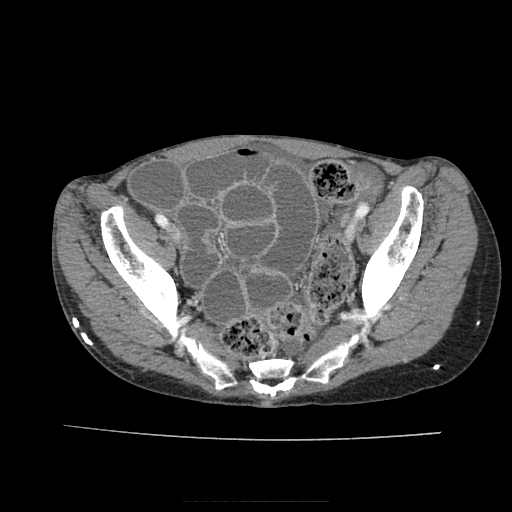
[im 65/184  soft-tissue]
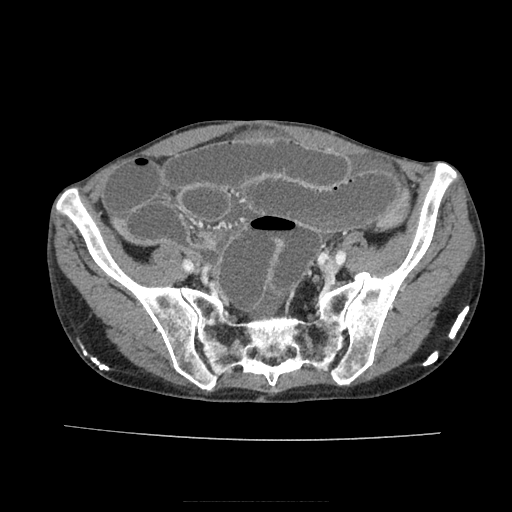
[im 77/184  soft-tissue]
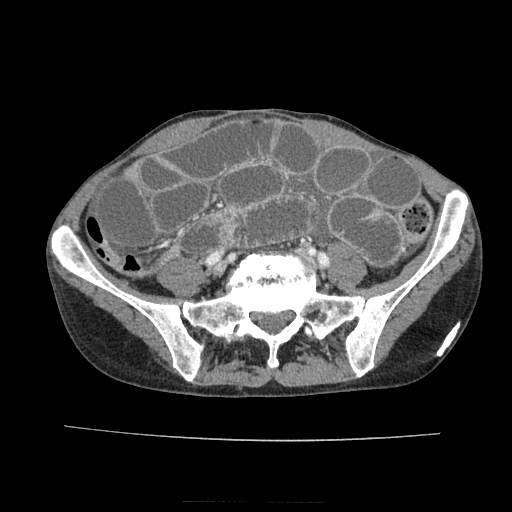
[im 95/184  soft-tissue]
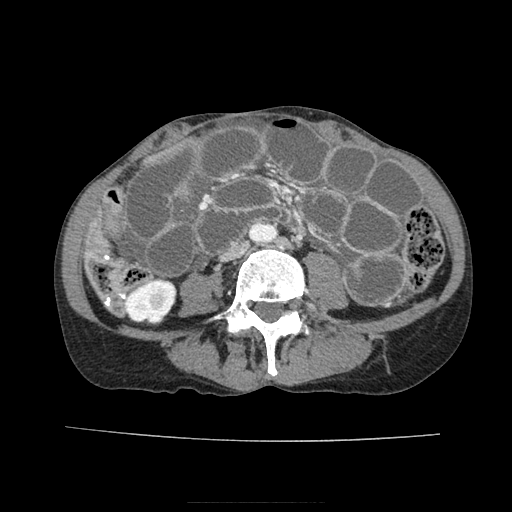
[im 107/184  soft-tissue]
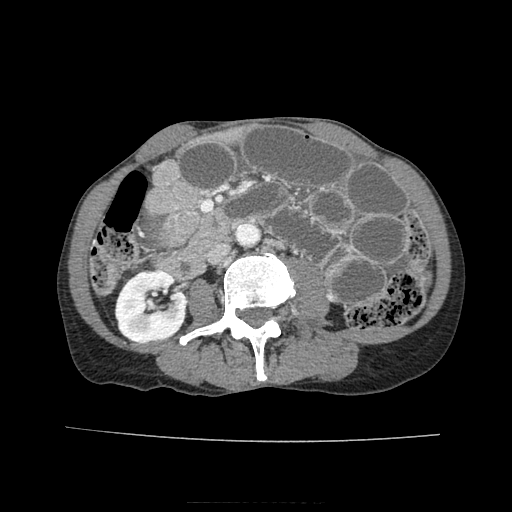
[im 119/184  soft-tissue]
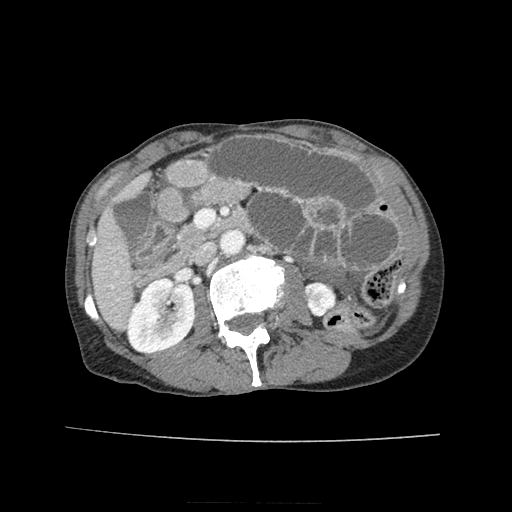
[im 119/184  bone]
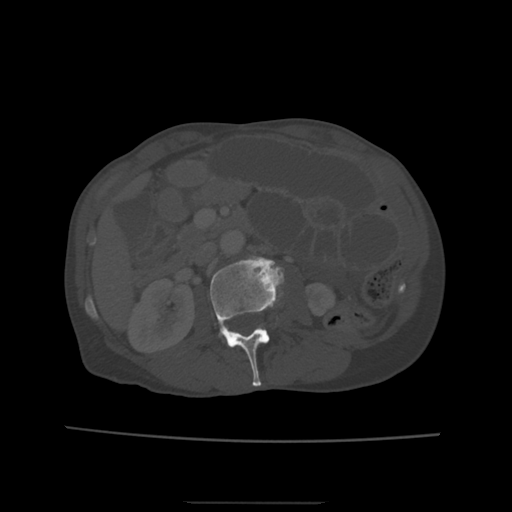
[im 130/184  soft-tissue]
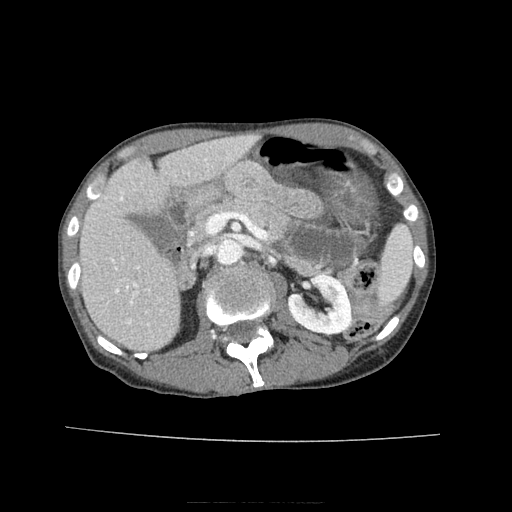
[im 148/184  soft-tissue]
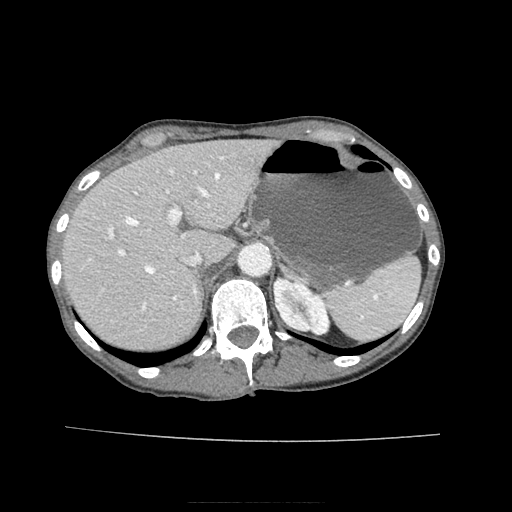
[im 160/184  soft-tissue]
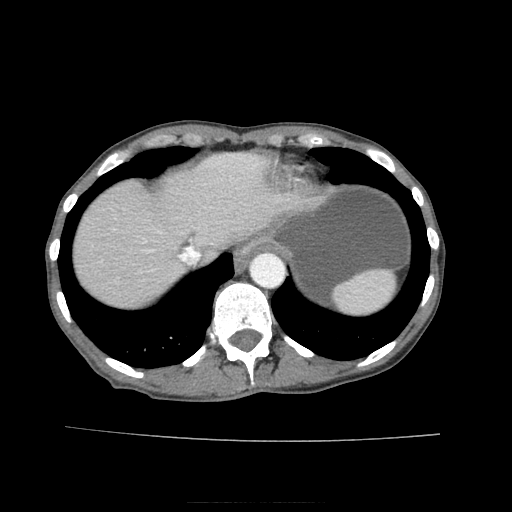
[im 172/184  soft-tissue]
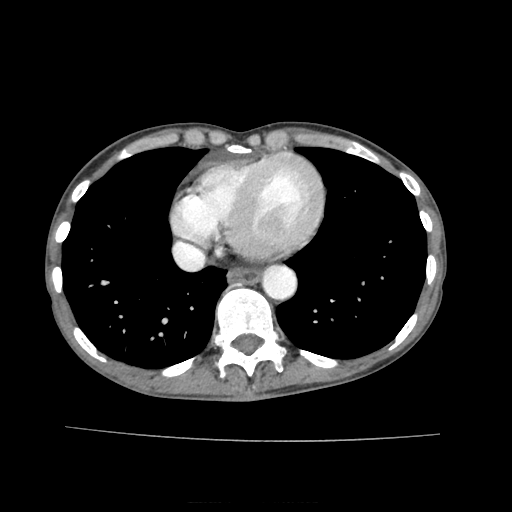

[Series 1001: coronal w/ · coronal · 0.90mm/px · 3 of 71 slices shown]
[im 24/71  soft-tissue]
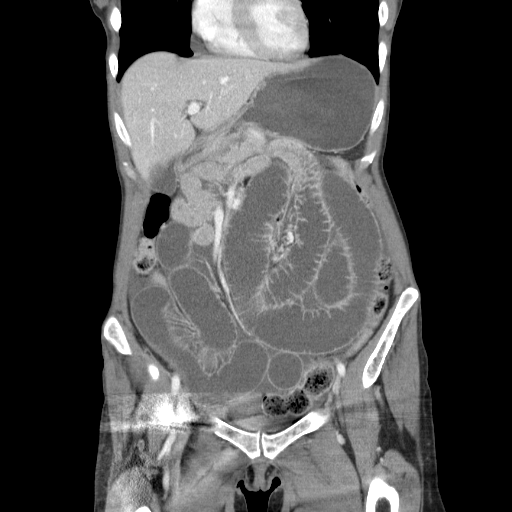
[im 32/71  soft-tissue]
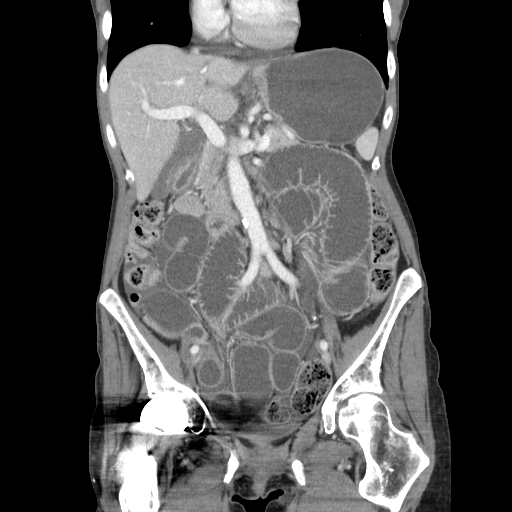
[im 39/71  soft-tissue]
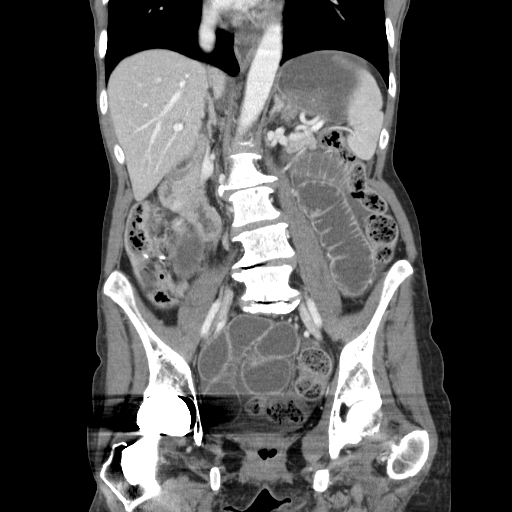

[16 of 46 positions shown; findings below may reference images not displayed]

FINDINGS: Lung bases appear clear. The liver demonstrates mild periportal edema and distended gallbladder. The portal vein is patent. The spleen is not enlarged. Pancreas appears homogeneous. Adrenal glands appear normal. There appears to be left renal cortical scarring.
There is patulous hyper enhancing appearance of the distal esophagus. There is a small hyper enhancing nodule seen anterior to the distal esophagus nonspecific. The stomach is distended and fluid-filled. There is mucosal edema and hyperenhancement of the distal stomach/proximal duodenum. There are dilated fluid-filled loops of small bowel which appears somewhat stacked in configuration measuring up to 3.5 cm. A transition point is question within the right lower quadrant and there are decompressed distal loops of small bowel. Anastomosis is seen along the ileocecal junction. There is moderate stool within the colon which is nondilated. There is small pelvic free fluid. There is no free intraperitoneal air identified. There is no bowel containing ventral abdominal wall defect. Abdominal aorta is tortuous with mild atherosclerosis but nonaneurysmal.
Within the pelvis, there is beam hardening artifact from right hip arthroplasty. Urinary bladder is decompressed. Uterus is not well visualized.
Review of bony structures demonstrates multifocal lumbar degenerative change most notable at L2-L3, L4-L5 and L5-S1 with lower lumbar facet arthropathy the greater on the right. There is mild lumbar scoliosis. No suspicious osseous abnormality.
IMPRESSION: 1. Dilated fluid-filled small bowel throughout the abdomen with distention of the stomach and possible gastritis and esophagitis. A tiny sub cm hyper enhancing nodule is seen anterior to the distal esophagus for which direct visualization is recommended. There appears to be transition point within the right lower quadrant and findings are most suggestive of small-bowel obstruction. There is decompressed distal small bowel and anastomotic sutures at the ileocecal junction. There is mild constipation and there is pelvic free fluid without free intraperitoneal air/evidence of perforation.
2. Right hip arthroplasty. Mild scoliosis, left renal cortical scarring.

## 2021-03-24 ENCOUNTER — Encounter: Admit: 2021-03-24 | Discharge: 2021-03-24 | Payer: 59

## 2021-04-07 ENCOUNTER — Encounter: Admit: 2021-04-07 | Discharge: 2021-04-07 | Payer: 59

## 2021-04-07 MED ORDER — OXYBUTYNIN CHLORIDE 5 MG PO TAB
ORAL_TABLET | 1 refills | 12.00000 days | Status: AC
Start: 2021-04-07 — End: ?

## 2021-04-07 MED ORDER — BACLOFEN 10 MG PO TAB
ORAL_TABLET | 1 refills | Status: AC
Start: 2021-04-07 — End: ?

## 2021-04-07 MED ORDER — GABAPENTIN 300 MG PO CAP
ORAL_CAPSULE | 1 refills | Status: AC
Start: 2021-04-07 — End: ?

## 2021-04-07 NOTE — Telephone Encounter
Refill requests for baclofen, oxybutynin, gabapentin, last office visit 01-18-21 long term medications refilled by Dr. Donnal Debar refills sent

## 2021-04-30 ENCOUNTER — Encounter: Admit: 2021-04-30 | Discharge: 2021-04-30 | Payer: 59

## 2021-05-04 ENCOUNTER — Encounter: Admit: 2021-05-04 | Discharge: 2021-05-04 | Payer: 59

## 2021-05-04 NOTE — Progress Notes
[facility] NEEDED FOR EXTERNAL IMAGING    Patient info:  Name: Leslie Hawkins    MRN: [id number]          DOB: 06-11-1960      Insurance:   Payor: Holland Falling / Plan: AETNA CHOICE POS II / Product Type: *No Product type* /      Order sent to Facility:  05/04/2021    Type of scan ordered:  MRI Head w/wo contrast    Name of facility where test will be performed:  [facility]    Diagnosis ICD10 Code:  D32.9-Meningioma    Ordering Provider:  Dr. Radene Ou    Scan needed by date:  05/31/2021

## 2021-05-07 IMAGING — MR MR head/brain wo/w con
1 series · 10 of 10 positions shown · IV contrast (agent unspecified)
Comparison: None

EXAMINATION: MR HEAD/BRAIN WO/W CON
HISTORY: meningioma(HCC) (Hx)
TECHNIQUE: MRI of the brain was performed without and with contrast according to standard protocol.

[Series 501: FLAIR · 10 of 22 frames shown]
[frame 1/22]
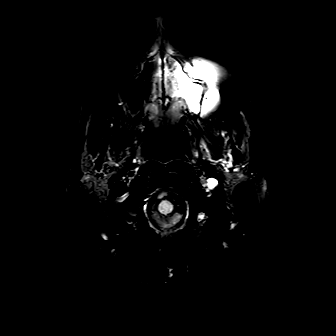
[frame 3/22]
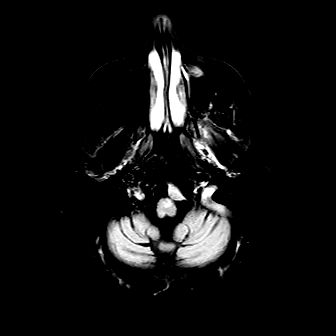
[frame 5/22]
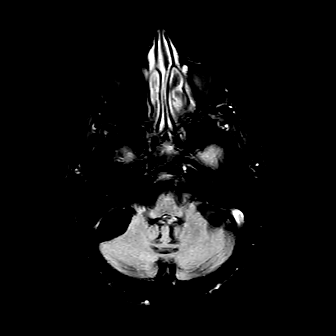
[frame 8/22]
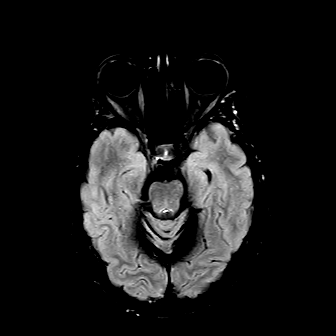
[frame 10/22]
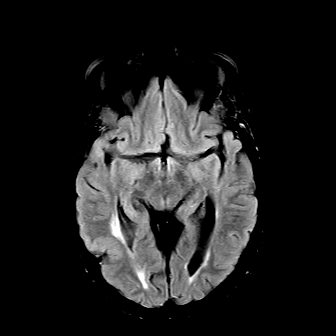
[frame 12/22]
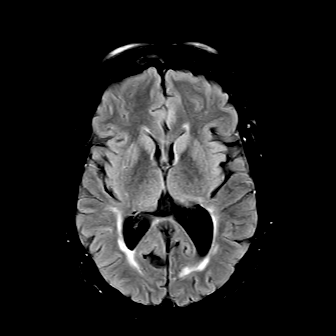
[frame 15/22]
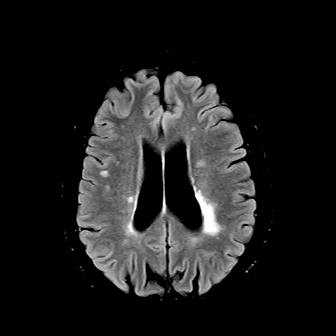
[frame 17/22]
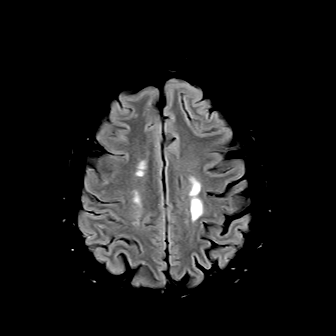
[frame 19/22]
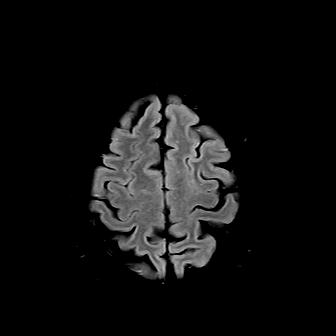
[frame 22/22]
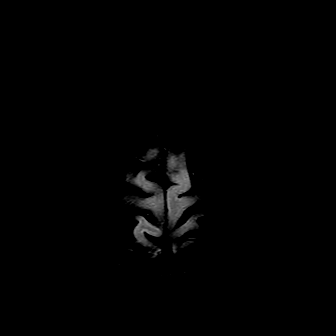

[10 of 10 positions shown; findings below may reference images not displayed]

FINDINGS: No evidence of acute or chronic hemorrhage is identified. No evidence of acute cerebral infarction is seen. There is mild cerebral and cerebellar volume loss with associated ex vacuo ventricular dilatation. No mass effect or midline shift is seen. The corpus callosum and sella appear normal. Anterior to the medulla on the left, there is a 1.6 x 0.8 cm solidly enhancing extra-axial dural-based mass with dural tails consistent with a meningioma as provided in the history. The mass abuts the medulla without mass effect, although there is FLAIR hyperintensity in the left anterior medulla. Periventricular and subcortical white matter FLAIR hyperintensities likely represent sequelae of chronic small vessel ischemic disease.
Other than bilateral cataract extractions, the visible portions of the orbits, paranasal sinuses, and mastoids appear normal. Normal flow voids are demonstrated in the carotid arteries and basilar artery. Degenerative changes in the cervical spine are incompletely imaged.
IMPRESSION: 1. Small meningioma anterior to the medulla on the left.
2. FLAIR hyperintensity in the left anterior medulla, which may represent hypertrophic olivary degeneration.

## 2021-05-10 ENCOUNTER — Encounter: Admit: 2021-05-10 | Discharge: 2021-05-10 | Payer: 59

## 2021-05-10 ENCOUNTER — Ambulatory Visit: Admit: 2021-05-10 | Discharge: 2021-05-10 | Payer: 59

## 2021-05-10 DIAGNOSIS — N319 Neuromuscular dysfunction of bladder, unspecified: Secondary | ICD-10-CM

## 2021-05-10 DIAGNOSIS — G35 Multiple sclerosis: Secondary | ICD-10-CM

## 2021-05-10 DIAGNOSIS — G4700 Insomnia, unspecified: Secondary | ICD-10-CM

## 2021-05-10 DIAGNOSIS — H269 Unspecified cataract: Secondary | ICD-10-CM

## 2021-05-10 DIAGNOSIS — M26609 Unspecified temporomandibular joint disorder, unspecified side: Secondary | ICD-10-CM

## 2021-05-10 DIAGNOSIS — F32A Depression: Secondary | ICD-10-CM

## 2021-05-10 NOTE — Patient Instructions
Thank you for choosing to let us care for you today.  Please read through the following information that will help us continue to provide the best care possible.     Communication:   Preferred method of communication is through MyChart message if the issue cannot wait  until your next scheduled follow up.    My Chart may be used for non-emergent communication.    Messages are not reviewed after hours or over the weekend/holidays/after 3PM. Staff will reply to your email within 48 business hours.    Results: For [facility] lab/imaging results, due to the CARES act the results automatically release to MyChart.  For Lab results- Dr. Lynch will address lab values, if there is any concern in your labs values the clinic will call you regarding those results. If your labs were done outside of the [facility], please send a message ensuring we have received those results.  Please allow up to 72 hours for review and response to your results if needed.     Emergency:   If you are having acute (new/sudden onset) or severe/worsening neurologic symptoms, please call 911 or seek care in the ED   After hours emergency: [phone], ask to speak with on-call neurologist    Helpful contact information:   For any imaging ordered on this visit please contact: [facility] scheduling: please call [phone] at your convenience to schedule your studies.   Referrals placed during the visit: if you have not heard from scheduling within one week, please call the call center at [phone] to get scheduling assistance.   Refills on medications, please first contact your pharmacy, who will fax a refill authorization request form to our office.  Weekdays only. Allow up to 2 business days for refills. Please plan ahead, as refills will not be filled after hours.   Scheduling staff may be reached at [phone] option 1 for scheduling needs.    Urgent nursing needs: Kairie Vangieson LPN may be contacted at [phone]. Staff will return your call within 24 business hours.       For Appointments:    To allow us to better care for you and stay on time, we appreciate you arriving 15 minutes prior to your scheduled appointment time.    If you are late to your appointment, we reserve the right to ask you to reschedule or wait until next available time to be seen in fairness to other patients scheduled that day.    There are times when we are running behind in clinic. Our goal is to always be on time, however, there are time when unexpected events occur with patients, which may cause a delay. We appreciate your understanding when this occurs.    If you have an MRI scheduled, please reach out to the clinic nurse ([phone]) or scheduling  ([phone]) ASAP to let them know the date, Dr. Lynch expects to see you in person or by telehealth for an appointment to go over those results with you.    [facility]  [facility]  Phone: [phone]  Fax: [phone]

## 2021-05-11 ENCOUNTER — Encounter: Admit: 2021-05-11 | Discharge: 2021-05-11 | Payer: 59

## 2021-05-11 DIAGNOSIS — H269 Unspecified cataract: Secondary | ICD-10-CM

## 2021-05-11 DIAGNOSIS — N319 Neuromuscular dysfunction of bladder, unspecified: Secondary | ICD-10-CM

## 2021-05-11 DIAGNOSIS — G4700 Insomnia, unspecified: Secondary | ICD-10-CM

## 2021-05-11 DIAGNOSIS — G35 Multiple sclerosis: Secondary | ICD-10-CM

## 2021-05-11 DIAGNOSIS — M26609 Unspecified temporomandibular joint disorder, unspecified side: Secondary | ICD-10-CM

## 2021-05-11 DIAGNOSIS — F32A Depression: Secondary | ICD-10-CM

## 2021-05-31 ENCOUNTER — Encounter: Admit: 2021-05-31 | Discharge: 2021-05-31 | Payer: 59

## 2021-06-01 ENCOUNTER — Encounter: Admit: 2021-06-01 | Discharge: 2021-06-01 | Payer: 59

## 2021-06-01 NOTE — Progress Notes
received radiology cd. Taking up to [facility] to import into patient chart.

## 2021-06-03 ENCOUNTER — Encounter: Admit: 2021-06-03 | Discharge: 2021-06-03 | Payer: 59

## 2021-06-03 ENCOUNTER — Ambulatory Visit: Admit: 2021-06-03 | Discharge: 2021-06-03 | Payer: 59

## 2021-06-03 DIAGNOSIS — N319 Neuromuscular dysfunction of bladder, unspecified: Secondary | ICD-10-CM

## 2021-06-03 DIAGNOSIS — H269 Unspecified cataract: Secondary | ICD-10-CM

## 2021-06-03 DIAGNOSIS — G35 Multiple sclerosis: Secondary | ICD-10-CM

## 2021-06-03 DIAGNOSIS — M26609 Unspecified temporomandibular joint disorder, unspecified side: Secondary | ICD-10-CM

## 2021-06-03 DIAGNOSIS — G4700 Insomnia, unspecified: Secondary | ICD-10-CM

## 2021-06-03 DIAGNOSIS — D329 Benign neoplasm of meninges, unspecified: Secondary | ICD-10-CM

## 2021-06-03 DIAGNOSIS — F32A Depression: Secondary | ICD-10-CM

## 2021-06-04 ENCOUNTER — Encounter: Admit: 2021-06-04 | Discharge: 2021-06-04 | Payer: 59

## 2021-06-04 MED ORDER — FLUOXETINE 20 MG PO CAP
20 mg | ORAL_CAPSULE | Freq: Every day | ORAL | 0 refills
Start: 2021-06-04 — End: ?

## 2021-06-28 ENCOUNTER — Encounter: Admit: 2021-06-28 | Discharge: 2021-06-28 | Payer: 59

## 2021-06-28 DIAGNOSIS — G35 Multiple sclerosis: Secondary | ICD-10-CM

## 2021-06-28 MED ORDER — BETASERON 0.3 MG SC KIT
0 refills
Start: 2021-06-28 — End: ?

## 2021-07-01 ENCOUNTER — Encounter: Admit: 2021-07-01 | Discharge: 2021-07-01 | Payer: 59

## 2021-07-01 NOTE — Progress Notes
Picked up patient MRI CD from [facility]. Mailing CD back to patient at address on file.

## 2021-07-12 ENCOUNTER — Encounter: Admit: 2021-07-12 | Discharge: 2021-07-12 | Payer: 59

## 2021-07-12 DIAGNOSIS — D329 Benign neoplasm of meninges, unspecified: Secondary | ICD-10-CM

## 2021-07-12 NOTE — Progress Notes
[facility] NEEDED FOR EXTERNAL IMAGING    Patient info:  Name: Leslie Hawkins    MRN: [id number]          DOB: 11/29/1960      Insurance:   Payor: Holland Falling / Plan: AETNA CHOICE POS II / Product Type: *No Product type* /      Order sent to Facility:  07/12/2021    Type of scan ordered:  MRI Head w/wo contrast    Name of facility where test will be performed:  [facility]    Diagnosis ICD10 Code:  D32.9 Meningioma    Ordering Provider:  Radene Ou    Scan needed by date:  10/02/2021

## 2021-07-19 ENCOUNTER — Encounter: Admit: 2021-07-19 | Discharge: 2021-07-19 | Payer: 59

## 2021-08-06 ENCOUNTER — Encounter: Admit: 2021-08-06 | Discharge: 2021-08-06 | Payer: 59

## 2021-08-30 ENCOUNTER — Encounter: Admit: 2021-08-30 | Discharge: 2021-08-30 | Payer: 59

## 2021-08-30 DIAGNOSIS — D329 Benign neoplasm of meninges, unspecified: Secondary | ICD-10-CM

## 2021-09-21 ENCOUNTER — Encounter: Admit: 2021-09-21 | Discharge: 2021-09-21 | Payer: 59

## 2021-09-21 NOTE — Telephone Encounter
E-signed orders for renal panel lab and MRI head sent to [facility] via fax with authorization number and associated CPT codes. This RN also phoned to radiology staff at [facility] to alert [place] if they do not receive this fax. Successful transmission sheet was printed immediately after faxing total of 4 pages. If further questions arise regarding pre-cert please reach out to [place] or Danae Chen, if this RN is unable to assist. This RN requested that [facility] also contact us when patient is scheduled so we may request imaging and lab results as appropriate. Patient was also called with updated information. - U.

## 2021-09-24 ENCOUNTER — Encounter: Admit: 2021-09-24 | Discharge: 2021-09-24 | Payer: 59

## 2021-09-24 DIAGNOSIS — G35 Multiple sclerosis: Secondary | ICD-10-CM

## 2021-09-24 DIAGNOSIS — Z79899 Other long term (current) drug therapy: Secondary | ICD-10-CM

## 2021-09-28 IMAGING — MR MR head/brain wo/w con
14 of 15 series · 24 of 48 positions shown · non-contrast
Comparison: 05/07/2021.

BRAINWW
REASON FOR EXAM: Meningioma
TECHNIQUE: Multiplanar, multisequence MRI of the brain was obtained with and without contrast.

[Series 101: survey · axial · 10.0mm · 0.94mm/px · 1 of 5 slices shown]
[im 1/5]
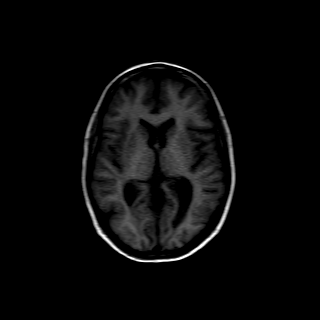

[Series 201: T1 · sagittal · 5.0mm · 0.45mm/px · 1 of 22 slices shown (1 of 2)]
[im 1/22]
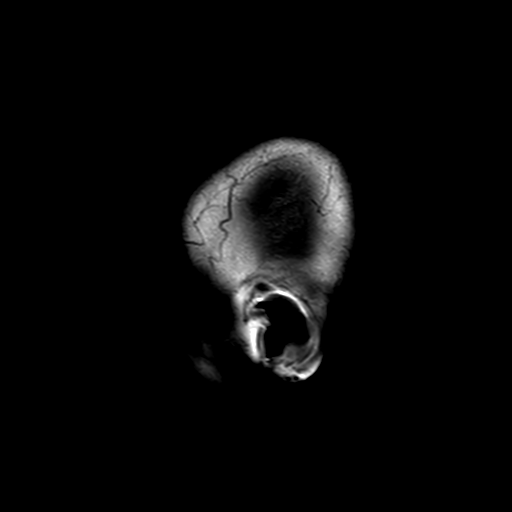

[Series 301: T1 · axial · 5.0mm · 0.45mm/px · 1 of 24 slices shown (2 of 2)]
[im 1/24]
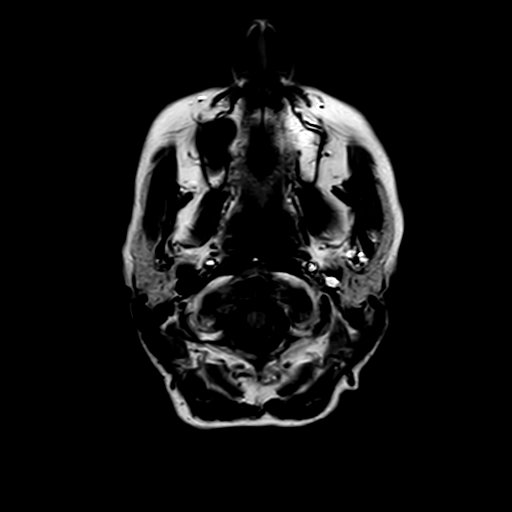

[Series 401: T2 · axial · 5.0mm · 0.34mm/px · 1 of 24 slices shown (1 of 2)]
[im 1/24]
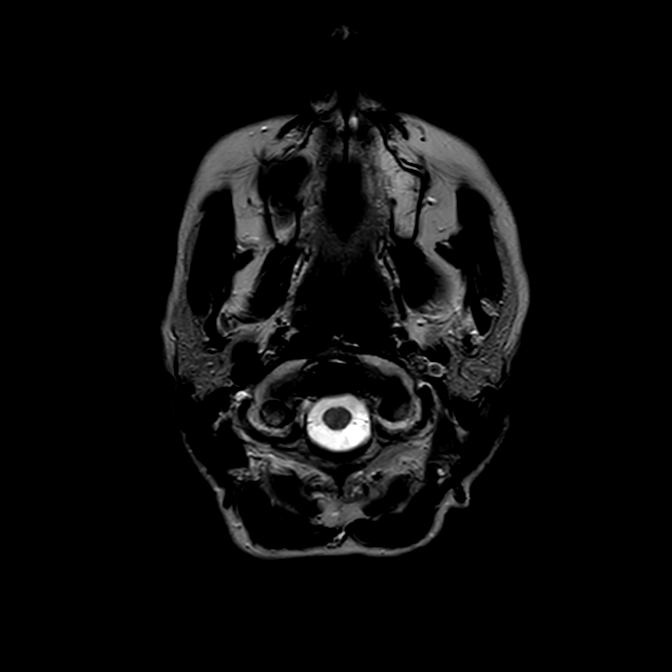

[Series 501: FLAIR · axial · 5.0mm · 0.68mm/px · 1 of 24 slices shown]
[im 1/24]
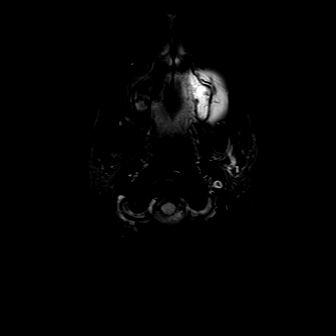

[Series 603: dadc · axial · 5.0mm · 1.20mm/px · z∈[-89,+59]mm · 2 of 24 slices shown]
[im 1/24]
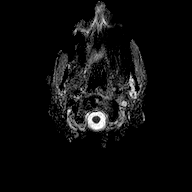
[im 24/24]
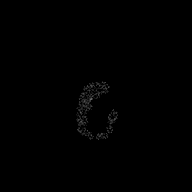

[Series 604: eeadc · axial · 5.0mm · 1.20mm/px · z∈[-89,+59]mm · 2 of 24 slices shown]
[im 1/24]
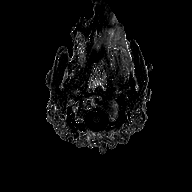
[im 24/24]
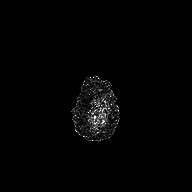

[Series 605: sb0 · axial · 5.0mm · 1.20mm/px · z∈[-89,+59]mm · 2 of 24 slices shown]
[im 1/24]
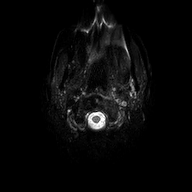
[im 24/24]
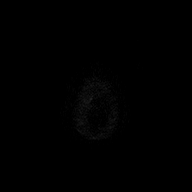

[Series 606: (id) · axial · 5.0mm · 1.20mm/px · z∈[-89,+59]mm · 2 of 24 slices shown]
[im 1/24]
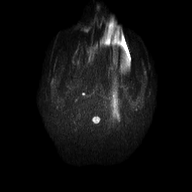
[im 24/24]
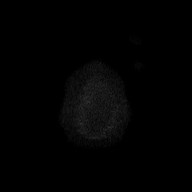

[Series 701: swip · axial · 2.0mm · 0.40mm/px · z∈[-77,-22]mm · 4 of 280 slices shown]
[im 16/280]
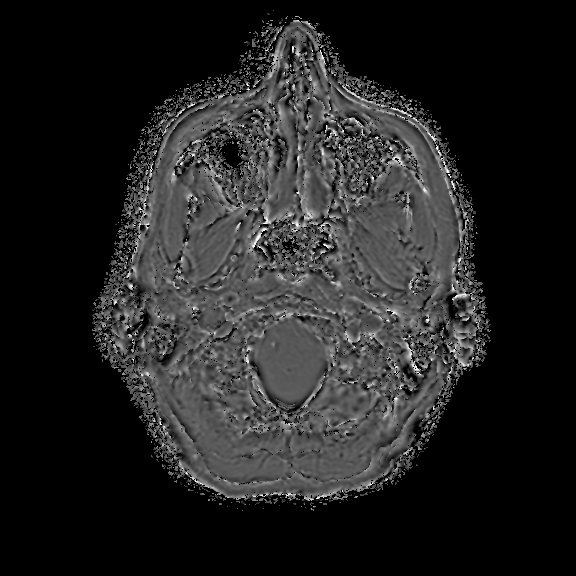
[im 47/280]
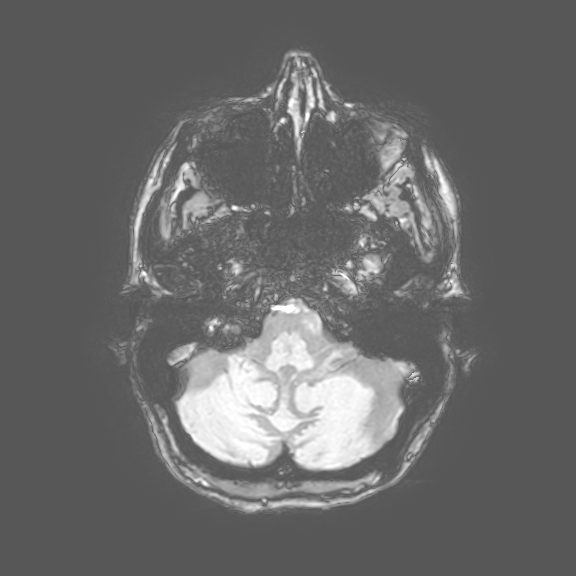
[im 78/280]
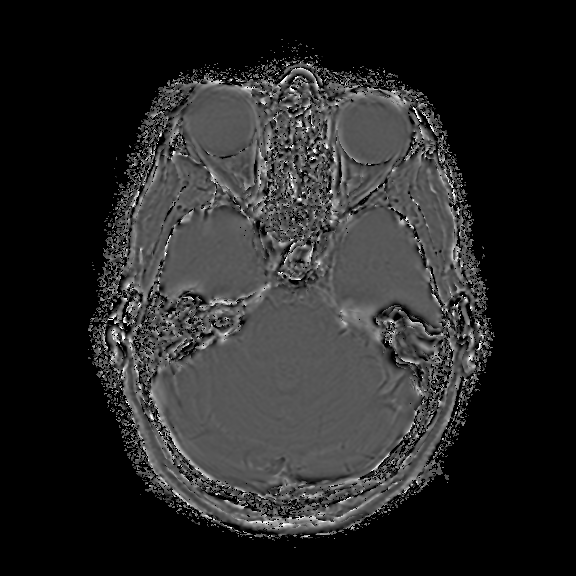
[im 125/280]
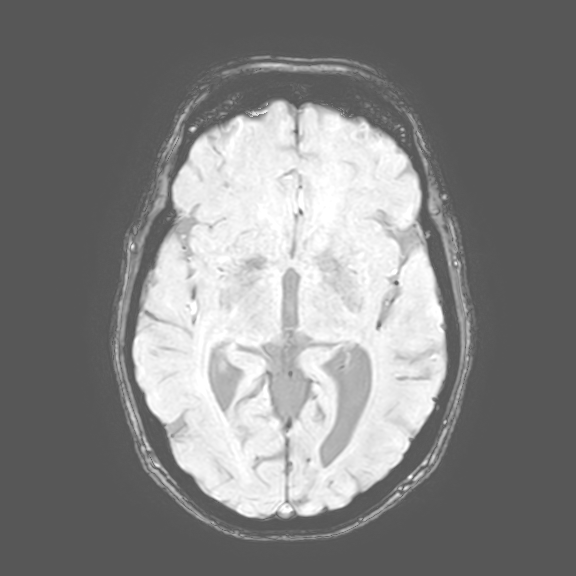

[Series 801: T2 · coronal · 5.0mm · 0.34mm/px · 2 of 30 slices shown (2 of 2)]
[im 1/30]
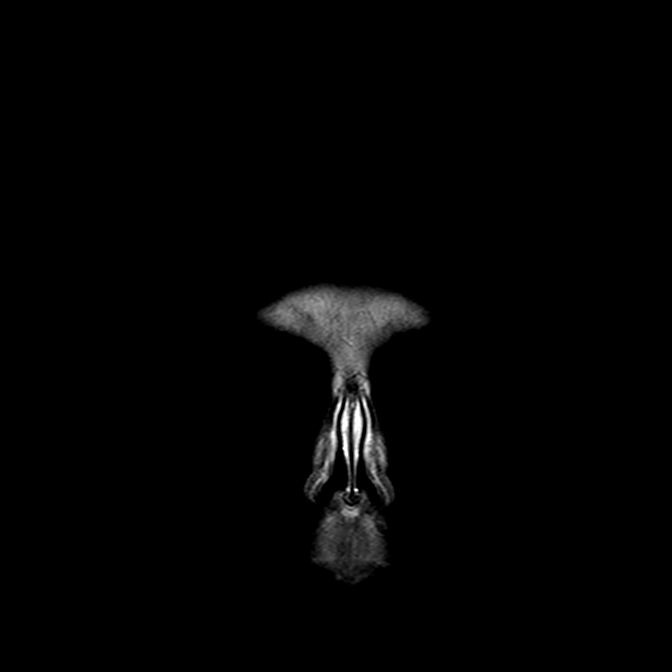
[im 30/30]
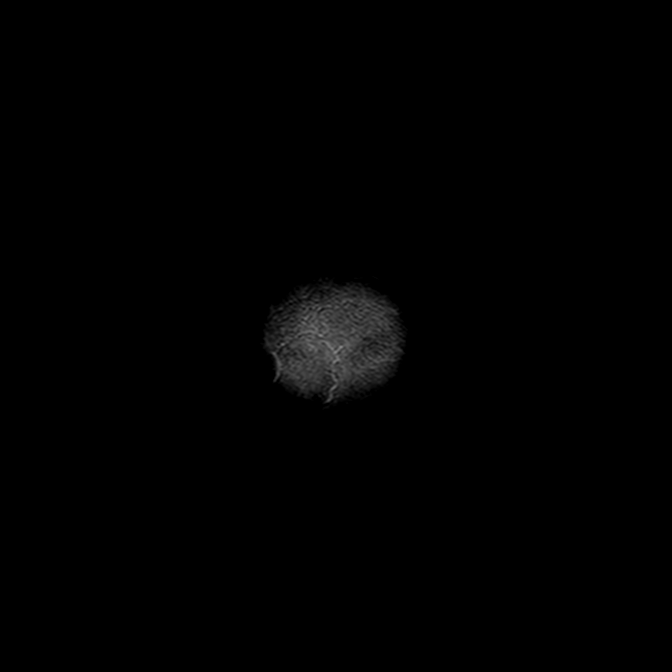

[Series 901: T1 post-contrast · sagittal · 5.0mm · 0.45mm/px · 1 of 22 slices shown (1 of 2)]
[im 1/22]
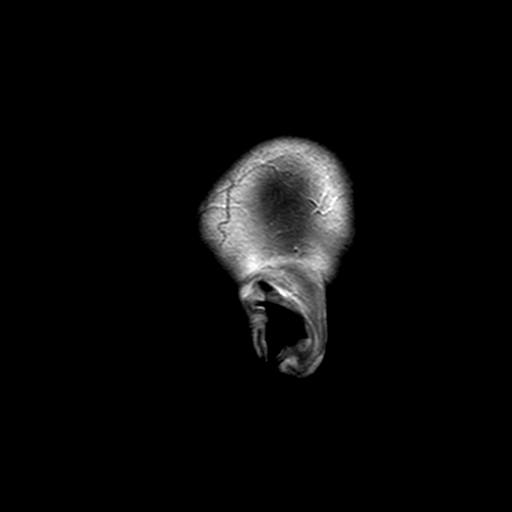

[Series 1001: T1 fat-sat post-contrast · axial · 5.0mm · 0.45mm/px · z∈[-89,+59]mm · 2 of 24 slices shown]
[im 1/24]
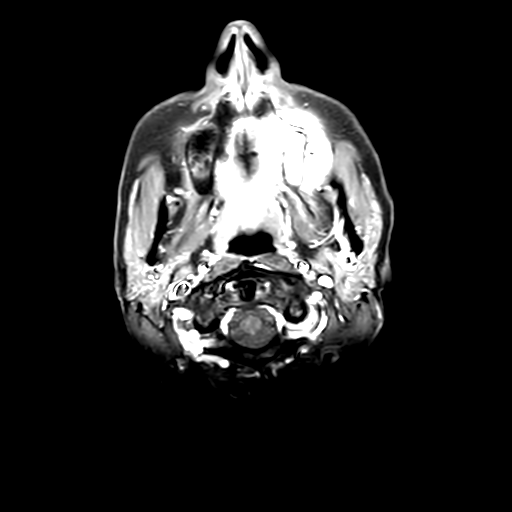
[im 24/24]
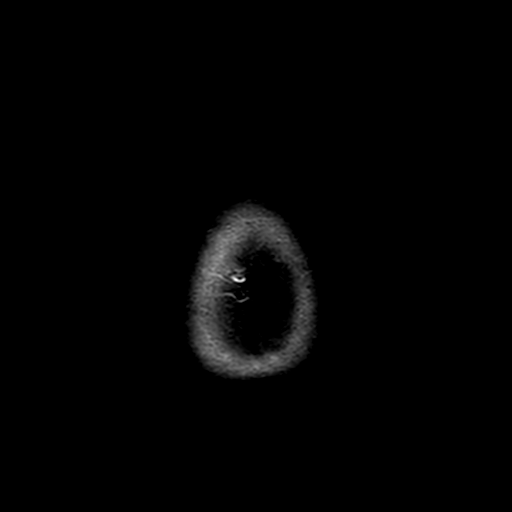

[Series 1101: T1 post-contrast · coronal · 5.0mm · 0.45mm/px · 2 of 30 slices shown (2 of 2)]
[im 1/30]
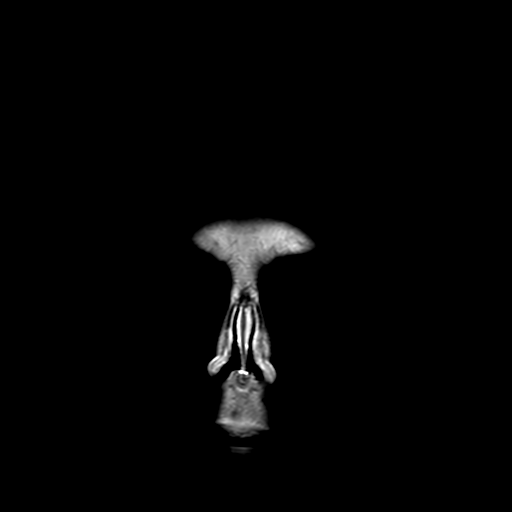
[im 30/30]
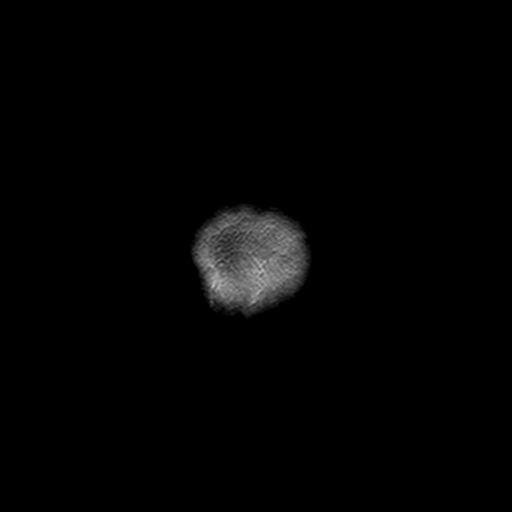

[24 of 48 positions shown; findings below may reference images not displayed]

FINDINGS: Focal confluent areas of hyperintense T2/FLAIR signal seen throughout the deep and periventricular
regions that are nonspecific in nature without pathologic enhancement.
The corticomedullary differentiation is well-maintained. No diffusion restriction identified to
suggest acute ischemia. No other focal parenchymal abnormalities or pathologic enhancement.
Generalized volume loss is seen with prominence of ventricular folia, system and sulci. This is most
pronounced in the posterior fossa. No midline shift, mass effect, intra or extra-axial hemorrhage.
The major intracranial vasculature demonstrates a normal flow-related pattern.
There are bilateral lens implants. Otherwise the globes, orbits, regional soft tissues and calvarium
unremarkable. The paranasal sinuses mastoid air cells have normal signal. A 1.5 x 1.2 x 1.4 cm left
parasagittal clival meningioma is identified (image 11 series [DATE], image 5 series 3663, image 16
series 0020) and on prior exam measured 1.6 x 1.0 x 1.6 cm.
There are bilateral lens implants. Otherwise the globes, orbits, regional soft tissues and calvarium
unremarkable. Paranasal sinuses mastoid air cells have normal signal.
IMPRESSION: 1. Essentially stable left parasagittal clival meningioma.
2. Stable volume loss predominantly involving the posterior fossa.
3. Scattered nonspecific white matter change likely microvascular nature.
4. No acute hemorrhage, ischemia or new mass.

## 2021-10-01 ENCOUNTER — Encounter: Admit: 2021-10-01 | Discharge: 2021-10-01 | Payer: 59

## 2021-10-05 ENCOUNTER — Encounter: Admit: 2021-10-05 | Discharge: 2021-10-05 | Payer: 59

## 2021-10-07 ENCOUNTER — Encounter: Admit: 2021-10-07 | Discharge: 2021-10-07 | Payer: 59

## 2021-10-07 ENCOUNTER — Ambulatory Visit: Admit: 2021-10-07 | Discharge: 2021-10-07 | Payer: 59

## 2021-10-07 DIAGNOSIS — D329 Benign neoplasm of meninges, unspecified: Secondary | ICD-10-CM

## 2021-10-08 ENCOUNTER — Encounter: Admit: 2021-10-08 | Discharge: 2021-10-08 | Payer: 59

## 2021-10-20 ENCOUNTER — Encounter: Admit: 2021-10-20 | Discharge: 2021-10-20 | Payer: 59

## 2021-11-12 NOTE — Patient Instructions
Thank you for choosing to let us care for you today.  Please read through the following information that will help us continue to provide the best care possible.     Communication:   Preferred method of communication is through MyChart message if the issue cannot wait  until your next scheduled follow up.    My Chart may be used for non-emergent communication.    Messages are not reviewed after hours or over the weekend/holidays/after 3PM. Staff will reply to your email within 48 business hours.    Results: For [facility] lab/imaging results, due to the CARES act the results automatically release to MyChart.  For Lab results- Dr. Lynch will address lab values, if there is any concern in your labs values the clinic will call you regarding those results. If your labs were done outside of the [facility], please send a message ensuring we have received those results.  Please allow up to 72 hours for review and response to your results if needed.     Emergency:   If you are having acute (new/sudden onset) or severe/worsening neurologic symptoms, please call 911 or seek care in the ED   After hours emergency: [phone], ask to speak with on-call neurologist    Helpful contact information:   For any imaging ordered on this visit please contact: [facility] scheduling: please call [phone] at your convenience to schedule your studies.   Referrals placed during the visit: if you have not heard from scheduling within one week, please call the call center at [phone] to get scheduling assistance.   Refills on medications, please first contact your pharmacy, who will fax a refill authorization request form to our office.  Weekdays only. Allow up to 2 business days for refills. Please plan ahead, as refills will not be filled after hours.   Scheduling staff may be reached at [phone] option 1 for scheduling needs.    Urgent nursing needs: Shabnam Ladd LPN may be contacted at [phone]. Staff will return your call within 24 business hours.       For Appointments:    To allow us to better care for you and stay on time, we appreciate you arriving 15 minutes prior to your scheduled appointment time.    If you are late to your appointment, we reserve the right to ask you to reschedule or wait until next available time to be seen in fairness to other patients scheduled that day.    There are times when we are running behind in clinic. Our goal is to always be on time, however, there are time when unexpected events occur with patients, which may cause a delay. We appreciate your understanding when this occurs.    If you have an MRI scheduled, please reach out to the clinic nurse ([phone]) or scheduling  ([phone]) ASAP to let them know the date, Dr. Lynch expects to see you in person or by telehealth for an appointment to go over those results with you.    [facility]  [facility]  Phone: [phone]  Fax: [phone]

## 2021-11-15 ENCOUNTER — Encounter: Admit: 2021-11-15 | Discharge: 2021-11-15 | Payer: 59

## 2021-11-15 ENCOUNTER — Ambulatory Visit: Admit: 2021-11-15 | Discharge: 2021-11-15 | Payer: 59

## 2021-11-15 DIAGNOSIS — R1319 Other dysphagia: Secondary | ICD-10-CM

## 2021-11-15 DIAGNOSIS — G35 Multiple sclerosis: Secondary | ICD-10-CM

## 2021-11-15 DIAGNOSIS — N319 Neuromuscular dysfunction of bladder, unspecified: Secondary | ICD-10-CM

## 2021-11-15 DIAGNOSIS — G4700 Insomnia, unspecified: Secondary | ICD-10-CM

## 2021-11-15 DIAGNOSIS — G629 Polyneuropathy, unspecified: Secondary | ICD-10-CM

## 2021-11-15 DIAGNOSIS — F32A Depression: Secondary | ICD-10-CM

## 2021-11-15 DIAGNOSIS — I10 Essential (primary) hypertension: Secondary | ICD-10-CM

## 2021-11-15 DIAGNOSIS — D329 Benign neoplasm of meninges, unspecified: Secondary | ICD-10-CM

## 2021-11-15 DIAGNOSIS — H269 Unspecified cataract: Secondary | ICD-10-CM

## 2021-11-15 DIAGNOSIS — R252 Cramp and spasm: Secondary | ICD-10-CM

## 2021-11-15 DIAGNOSIS — M26609 Unspecified temporomandibular joint disorder, unspecified side: Secondary | ICD-10-CM

## 2021-11-15 NOTE — Progress Notes
Date of Service: 11/15/2021    Subjective:             JEWELLE WHITNER is a 61 y.o. female.    History of Present Illness  She tells me she was hospitalized for a small bowel obstruction for 5 days. After this, she was weaker and had difficulty bending and straightening knee, also right ankle pointing down and in. Tried baclofen with higher dose leading to side effect. Tizanidine didn't help. Had side effects historically on diazepam so is reluctant to try this again. Has been working with PT. Did some dry needling which helped her ankle for 4 days, which transiently improved walking.       Review of Systems   Constitutional: Positive for activity change.   Eyes: Positive for visual disturbance.   Cardiovascular: Positive for leg swelling.   Gastrointestinal: Positive for constipation.   Musculoskeletal: Positive for gait problem.   All other systems reviewed and are negative.        Objective:         ??? amLODIPine (NORVASC) 10 mg tablet Take one tablet by mouth daily.   ??? ascorbic acid (VITAMIN C) 500 mg tablet Take one tablet by mouth twice daily.   ??? baclofen (LIORESAL) 10 mg tablet TAKE 1 TABLET BY MOUTH THREE TIMES DAILY   ??? bevacizumab (AVASTIN IV) Place 1 Dose into or around eye(s) every 90 days. Injection into Left eye q 3 mo.   ??? brimonidine (ALPHAGAN P) 0.15 % ophthalmic solution Apply one drop to both eyes twice daily.   ??? calcium carbonate (TUMS) 500 mg (200 mg elemental calcium) chewable tablet Chew two tablets by mouth twice daily as needed. Extra Strength.   ??? Cholecalciferol (Vitamin D3) 2,000 unit cap Take one capsule by mouth daily.   ??? docusate (COLACE) 100 mg capsule Take one capsule by mouth daily. (Patient taking differently: Take one capsule by mouth twice daily.)   ??? fish oil /omega-3 fatty acids (SEA-OMEGA) 340/1000 mg capsule Take two capsules by mouth daily.   ??? FLUoxetine (PROZAC) 20 mg capsule Take 1 capsule by mouth once daily   ??? gabapentin (NEURONTIN) 300 mg capsule TAKE 1 CAPSULE BY MOUTH THREE TIMES DAILY   ??? interferon beta-1b (BETASERON) 0.3 mg/1.2 mL injection KIT INJECT 1 ML SUBCUTANEOUSLY EVERY 48 HOURS   ??? lisinopriL (ZESTRIL) 20 mg tablet Take one tablet by mouth daily.   ??? melatonin 3 mg tab Take 5 mg by mouth at bedtime daily.   ??? Miscellaneous Medical Supply misc Provide pt with padding adjustment with new velcro for R AFO brace.   DX: Multiple Sclerosis, G35   ??? Miscellaneous Medical Supply misc Straight in/out catheters 14 French 8-10 times per day  Medical Necessity   Multiple Sclerosis   G35   ??? multivitamin cmb No.21-iron-folic acid (CENTRUM) 18-400 mg-mcg tablet Take one tablet by mouth daily.   ??? other medication Place  into or around eye(s). Retina specialist- injection in eye   ??? oxyBUTYnin chloride (DITROPAN) 5 mg tablet TAKE 1 TABLET BY MOUTH THREE TIMES DAILY   ??? promethazine (PHENERGAN) 12.5 mg tablet Take one tablet by mouth four times daily.     Vitals:    11/15/21 1543   BP: (!) 168/82   BP Source: Arm, Left Upper   Pulse: 64   Temp: 36.4 ???C (97.6 ???F)   TempSrc: Temporal   PainSc: Zero   Weight: 55.8 kg (123 lb)   Height: 172.7 cm (5' 7.99)  Body mass index is 18.71 kg/m???.     Physical Exam  Depression Screening was performed on Lunna Brazil in clinic today. Based on the score of 0, no follow up action or recommendations are necessary at this time.    Mental Status: alert and oriented, fund of knowledge and memory intact.  Speech: no dysarthria, comprehension and fluency intact.  Motor: right knee flexion 4, knee extension 5, ankle dorsiflexion 2, ankle plantar flexion 5. [place] spasticity 3 knee extension, 4 ankle plantar flexion, 3 ankle inversion  Gait:  right > left spastic paresis, right knee extension with circumduction, foot drop and inversion       Assessment and Plan:    Problem   Spasticity    Failed: baclofen, tizanidine          MS (multiple sclerosis) (HCC)  Ms. 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    -Defer cessation of amlodipine for leg swelling to PCP for further adjustment of BP meds.  -Okay to continue dry needling.  -Appreciate Dr. Loralee Pacas assessment for Botox injections.  -Continue Interferon-beta.  -Continue PT and ROM stretching at home with use of elastic band. Discussed that prior level of ROM/tone will take months to return and may not return at all.  -Will use meningioma MRI imaging at 3 months to monitor MS progression.    Spasticity  I do think she is a good candidate for Botox to treat intractable spasticity, particularly ankle plantar flexion with inversion posture. We discussed the stability that increased tone can provide with knee extension posture and Botox treatment in involved muscles could render weakness in knee extension worsening gait and risking knee injury. These are large muscles and hers are strong and quite spastic (particularly rectus femoris), therefore do think proceeding with Botox is indicated here. We discussed pros and cons of Botox, procedure. We mutually agreed to proceed.    Right leg:  ??? Medial gastrocnemius: 2x20  ??? Lateral gastrocnemius: 2x20  ??? Soleus: 4x20  ??? Posterior tibialis: 40  ??? Rectus femoris: 20  ??? Vastus lateralis: 20  ??? Vastus medialis: 20  Total: 260 U      Return to clinic for Botox.    Demetrio Lapping, DO  Assistant Professor  Neuroimmunology Division       Total Time Today was 22 minutes in the following activities: Preparing to see the patient, Performing a medically appropriate examination and/or evaluation and Counseling and educating the patient/family/caregiver

## 2021-11-15 NOTE — Progress Notes
Date of Service: 11/15/2021    Subjective:             Leslie Hawkins is a 61 y.o. 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    History of Present Illness    Leslie Hawkins is here for 90-month follow-up.  She was just recently seen by Dr. Donita Brooks for evaluation of Botox injection on her right lower extremity, who stated that she is a candidate for Botox since baclofen and tizanidine had failed her (and higher doses of baclofen have adverse effects).  She states that since her last visit with our clinic, she has had feet swelling specifically in her right foot that the support hoses have not helped.  Her fingers are also swollen.  She states this started after being hospitalized for a small bowel obstruction earlier this year, around the time her amlodipine was started.  She denies any pain, skin changes, new numbness or tingling with the swelling.  Functionally, she is using the fold walker at all times.  She did complete 6 weeks of physical therapy 2 times per week at [facility] with much improvement, and then her progress stagnated for your 6 weeks, and then her physical therapy stopped as she ran out of PT sessions from insurance.  She had recently underwent MRI brain imaging in late September of this year to check for meningioma, which had shrunk.  She states that she is set to get another MRI brain in 3 months.  She denies any flareups with her multiple sclerosis.    Medical History:   Diagnosis Date   ??? Cataract     diplopia-for cataract surgery   ??? Depression    ??? Hypertension    ??? Insomnia    ??? 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 Neuropathy    ??? Other dysphagia occasional heart burn   ??? Secondary progressive multiple sclerosis (HCC)     gradually worsening,dysarthria ; RRMS   ??? TMJ (temporomandibular joint disorder)      Surgical History:   Procedure Laterality Date   ??? HYSTERECTOMY  2007   ??? CATARACT REMOVAL  2011   ??? EXPLORATORY LAPAROTOMY WITHOUT BIOPSY, ILEALCOLECTOMY, WOUND VAC PLACEMENT N/A 09/17/2017    Performed by Guinevere Scarlet, MD at [facility] OR   ??? REOPENING RECENT LAPAROTOMY, ILEOCOLONIC ANASTAMOSIS, ABDOMINAL WASHOUT, COLON RESECTION, FASCIAL CLOSURE N/A 09/19/2017    Performed by Ronn Melena., MD at [facility] OR   ??? PARTIAL HIP ARTHROPLASTY  02/2019   ??? HX BRAIN SURGERY  9.2.22    Radiation for meningioma     Allergies   Allergen Reactions   ??? Morphine HIVES   ??? Ampyra [Dalfampridine] SEE COMMENTS     Erythema multiforme   ??? Codeine NAUSEA AND VOMITING          Review of Systems   Constitutional: Positive for activity change.   Eyes: Positive for visual disturbance.   Cardiovascular: Positive for leg swelling.   Gastrointestinal: Positive for constipation.   Musculoskeletal: Positive for gait problem.   All other systems reviewed and are negative.        Objective:         ??? amLODIPine (NORVASC) 10 mg tablet Take one tablet by mouth daily.   ??? ascorbic acid (VITAMIN C) 500 mg tablet Take one tablet by mouth twice daily.   ??? baclofen (LIORESAL) 10 mg tablet  TAKE 1 TABLET BY MOUTH THREE TIMES DAILY   ??? bevacizumab (AVASTIN IV) Place 1 Dose into or around eye(s) every 90 days. Injection into Left eye q 3 mo.   ??? brimonidine (ALPHAGAN P) 0.15 % ophthalmic solution Apply one drop to both eyes twice daily.   ??? calcium carbonate (TUMS) 500 mg (200 mg elemental calcium) chewable tablet Chew two tablets by mouth twice daily as needed. Extra Strength.   ??? Cholecalciferol (Vitamin D3) 2,000 unit cap Take one capsule by mouth daily.   ??? docusate (COLACE) 100 mg capsule Take one capsule by mouth daily. (Patient taking differently: Take one capsule by mouth twice daily.)   ??? fish oil /omega-3 fatty acids (SEA-OMEGA) 340/1000 mg capsule Take two capsules by mouth daily.   ??? FLUoxetine (PROZAC) 20 mg capsule Take 1 capsule by mouth once daily   ??? gabapentin (NEURONTIN) 300 mg capsule TAKE 1 CAPSULE BY MOUTH THREE TIMES DAILY   ??? interferon beta-1b (BETASERON) 0.3 mg/1.2 mL injection KIT INJECT 1 ML SUBCUTANEOUSLY EVERY 48 HOURS   ??? lisinopriL (ZESTRIL) 20 mg tablet Take one tablet by mouth daily.   ??? melatonin 3 mg tab Take 5 mg by mouth at bedtime daily.   ??? Miscellaneous Medical Supply misc Provide pt with padding adjustment with new velcro for R AFO brace.   DX: Multiple Sclerosis, G35   ??? Miscellaneous Medical Supply misc Straight in/out catheters 14 French 8-10 times per day  Medical Necessity   Multiple Sclerosis   G35   ??? multivitamin cmb No.21-iron-folic acid (CENTRUM) 18-400 mg-mcg tablet Take one tablet by mouth daily.   ??? other medication Place  into or around eye(s). Retina specialist- injection in eye   ??? oxyBUTYnin chloride (DITROPAN) 5 mg tablet TAKE 1 TABLET BY MOUTH THREE TIMES DAILY   ??? promethazine (PHENERGAN) 12.5 mg tablet Take one tablet by mouth four times daily.     Vitals:    11/15/21 1548   BP: (!) 168/82   BP Source: Arm, Left Upper   Pulse: 64   Temp: 36.4 ???C (97.6 ???F)   TempSrc: Temporal   PainSc: Zero   Weight: 55.8 kg (123 lb)   Height: 172.7 cm (5' 7.99)     Body mass index is 18.71 kg/m???.     Physical Exam  Depression Screening was performed on Genna Gareau in clinic today. Based on the score of 0, no follow up action or recommendations are necessary at this time.  Examination    Mental Status Exam: Patient is alert and oriented in all four spheres. There is normal short and long term memory. Language functions are normal both for comprehension and expression.  Speech and Language: Normal  HEENT: Normal.  Extremities: No bilateral lower extremity edema  Cranial Nerve Exam:   Cranial Nerve II Right Left   Visual Acuity 20/30 glasses 20/30 glasses    Pupil  pupils equal round reactive to light bilaterally    Visual Fields  no visual field defects bilaterally    Fundoscopic     Color Vision       Cranial Nerves III-XII Right Left   III, IV, VI (EOM's)  EOMI bilaterally    V  facial sensation equal and intact bilaterally    VII  symmetric smile present    VIII     IX, X     XI     XII  no tongue deviation      Nystagmus: None    Motor:  R L   R L   Neck flexors    Hip flexors 4 5   Neck extensors    Hip abductors     Shoulder Abductors 5 5  Hip extensors     Elbow flexors 5 5  Hip adductors     Elbow extensors 5 5  Knee flexors 4 5   Wrist flexors    Knee extensors 5 5   Wrist extensors 5 5  Ankle dorsiflexors 4 5   Finger flexors 5 5  Ankle plantar flexors 4 5   Finger abductors 5 5  Ankle inversion         Ankle eversion         Toe flexors         Toe extensors       Bulk and Tone:    Upper Extremity R L   Atrophy No No   Increased Tone No No   Fasciculations No No     Lower Extremity R L   Atrophy No No   Increased Tone  rigid, knee extended and ankle plantarflexed and inverted 3/4 at quad, 4/4 at ankle. Passible achilles contracture. No   Fasciculations No No     Reflexes     Jaw      R L   Biceps  2+  2+   Brachioradialis  2+  2+   Triceps     Knee  unable to assess given rigidity  2+   Ankle   8-9 beats of ankle clonus present     Cerebellar/Fine Motor R L   Finger nose finger Normal Normal   Heel to shin     Finger tapping     Foot tapping     Rapid alternating movements       Gait: Wide based gait with spastic right leg with use of walker  Ambulation Index: 26.40 walker   Romberg:     9-hole peg  RH 30.32  LH 25.19    SDMT 44       Assessment and Plan:    Problem   Meningioma (Hcc)    07/2020 MRI brain  Unchanged size of the dome shaped extra-axial lesion extending along the left jugular tubercle and left ventrolateral foramen magnum dural reflection measuring 1.5 x 1.0 cm (series 16 image 47) which demonstrates homogeneous enhancement. There is unchanged mild associated localized mass effect and subtle deformation of the ventral brainstem. There is no midline shift or herniation.     Radiation Therapy completed 09/2020    Repeat MRI 04/2021  Lesion appears to be unchanged in size. There may be some left olivary hyperintensity on T2 next to the the tumor.     Ms (Multiple Sclerosis) (Hcc)    Multiple Sclerosis Subtype: Secondary progressive   Symptom onset: 1992  Date of Diagnosis: 1992  Prior Medication failures: Avonex, Copaxone, Imuran, NBI 5788  Current DMD: Betaseron   Last MRI 04/2021 (my interpretation)    1. ???Numerous FLAIR hyperintense supratentorial white matter lesions and periatrial predominant cerebral volume loss, consistent with clinically reported multiple sclerosis. These are unchanged from previous scan   2. ???Moderate diffuse cerebellar atrophy.   3. ???Small extra-axial lesion along the left jugular tubercle and foramen magnum dural reflection, likely reflecting a meningioma with mild localized mass effect. This is unchanged from 2022. There does appear to be a mild increased T2 signal intensity in the left upper medullar near the olive.   No enhancing lesions outside  of the meningioma            MS (multiple sclerosis) (HCC)  She has continued to have severe spasticity after her episode of small Bowel obstruction with deconditioning. She has had  A little improvement- ambulation index is a little better. Spasticity remains quite severe, with failure of Tizanidine and baclofen. She is reluctant to try diazepam because of previous problems with tiredness with it.   --Appreciate Botox evaluation by Dr. Donita Brooks with Botox for mainly the ankle. I encouraged continued stretching exercises. I think the combination of botox and stretching will likely help with her gait.   Will use meningioma MRI brain imaging for multiple sclerosis monitoring and progression.    -Continue interferon beta.    Meningi[facility])  She reports new MRI has shown shrinkage of the tumor.   We will continue to monitor.     Written by or in conjunction with:  Cecile Hearing, MD   PGY-2 PM&R    ATTESTATION  I personally performed the key portions of the E/M visit, discussed the case with the resident, and concur with the resident's evaluation and plan stated above. I have altered the note to better reflect my evaluation and plan.    Lynnell Jude MD  Director of [place] [facility] for [facility]

## 2021-11-15 NOTE — Patient Instructions
Thank you for choosing to let us care for you today.  Please read through the following information that will help us continue to provide the best care possible.     Communication:  Preferred method of communication is through MyChart message if the issue cannot wait until your next scheduled follow up.   MyChart may be used for non-emergent communication.   Messages are not reviewed after hours or over the weekend/holidays/after 3PM. Staff will reply to your email within 48 business hours.   Results: If you do not hear from us within one week of a lab or imaging study being completed, please contact us to ensure that we have received the results. This is especially challenging when tests are done outside of the [facility] system, as many times results do not make it back to our office for a variety of reasons. For [facility] lab/imaging results, due to the CARES act the results automatically release to MyChart.  Dr. Thuringer will continue to send you a result note on MyChart for any labs that she orders.  With these changes you may see your results before Dr. Thuringer does.   Please allow up to 72 hours for review and response to your results.     Emergency:  If you are having acute (new/sudden onset) or severe/worsening neurologic symptoms, please call 911 or seek care in the ED.  After hours emergency: [phone], ask to speak with on-call neurologist    Helpful contact information:  [facility] scheduling: please call [phone] at your convenience to schedule your studies.  Referrals placed during the visit: if you have not heard from scheduling within one week, please call the call center at [phone] to get scheduling assistance.  Refills on medications, please first contact your pharmacy, who will fax a refill authorization request form to our office.  Weekdays only. Allow up to 2 business days for refills. Please plan ahead, as refills will not be filled after hours.  Scheduling staff may be reached at [phone] option 1 for scheduling needs.   Urgent nursing needs: Loa Idler LPN may be contacted at [phone]. Staff will return your call within 24 business hours.   Pharmacist (specialty medication questions): [phone]    For Appointments:   To allow us to better care for you and stay on time, we appreciate you arriving 15 minutes prior to your scheduled appointment time.   If you are late to your appointment, we reserve the right to ask you to reschedule or wait until next available time to be seen in fairness to other patients scheduled that day.   There are times when we are running behind in clinic. Our goal is to always be on time, however, there are time when unexpected events occur with patients, which may cause a delay. We appreciate your understanding when this occurs.      [facility]  [facility]  Phone: [phone]  Fax: [phone]

## 2021-11-16 NOTE — Assessment & Plan Note
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   We will continue to monitor.

## 2021-11-29 ENCOUNTER — Encounter: Admit: 2021-11-29 | Discharge: 2021-11-29 | Payer: 59

## 2021-11-29 ENCOUNTER — Ambulatory Visit: Admit: 2021-11-29 | Discharge: 2021-11-29 | Payer: 59

## 2021-11-29 DIAGNOSIS — G35 Multiple sclerosis: Secondary | ICD-10-CM

## 2021-11-29 DIAGNOSIS — G8111 Spastic hemiplegia affecting right dominant side: Secondary | ICD-10-CM

## 2021-11-29 MED ORDER — ONABOTULINUMTOXINA 100 UNIT IJ SOLR
260 [IU] | Freq: Once | INTRAMUSCULAR | 0 refills | Status: CP
Start: 2021-11-29 — End: ?
  Administered 2021-11-29: 17:00:00 260 [IU] via INTRAMUSCULAR

## 2021-11-29 NOTE — Patient Instructions
Thank you for choosing to let us care for you today.  Please read through the following information that will help us continue to provide the best care possible.     Communication:  Preferred method of communication is through MyChart message if the issue cannot wait until your next scheduled follow up.   MyChart may be used for non-emergent communication.   Messages are not reviewed after hours or over the weekend/holidays/after 3PM. Staff will reply to your email within 48 business hours.   Results: If you do not hear from us within one week of a lab or imaging study being completed, please contact us to ensure that we have received the results. This is especially challenging when tests are done outside of the [facility] system, as many times results do not make it back to our office for a variety of reasons. For [facility] lab/imaging results, due to the CARES act the results automatically release to MyChart.  Dr. Thuringer will continue to send you a result note on MyChart for any labs that she orders.  With these changes you may see your results before Dr. Thuringer does.   Please allow up to 72 hours for review and response to your results.     Emergency:  If you are having acute (new/sudden onset) or severe/worsening neurologic symptoms, please call 911 or seek care in the ED.  After hours emergency: [phone], ask to speak with on-call neurologist    Helpful contact information:  [facility] scheduling: please call [phone] at your convenience to schedule your studies.  Referrals placed during the visit: if you have not heard from scheduling within one week, please call the call center at [phone] to get scheduling assistance.  Refills on medications, please first contact your pharmacy, who will fax a refill authorization request form to our office.  Weekdays only. Allow up to 2 business days for refills. Please plan ahead, as refills will not be filled after hours.  Scheduling staff may be reached at [phone] option 1 for scheduling needs.   Urgent nursing needs: Jyssica Rief LPN may be contacted at [phone]. Staff will return your call within 24 business hours.   Pharmacist (specialty medication questions): [phone]    For Appointments:   To allow us to better care for you and stay on time, we appreciate you arriving 15 minutes prior to your scheduled appointment time.   If you are late to your appointment, we reserve the right to ask you to reschedule or wait until next available time to be seen in fairness to other patients scheduled that day.   There are times when we are running behind in clinic. Our goal is to always be on time, however, there are time when unexpected events occur with patients, which may cause a delay. We appreciate your understanding when this occurs.      [facility]  [facility]  Phone: [phone]  Fax: [phone]

## 2021-11-29 NOTE — Procedures
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:    Description of Movements: right spastic ankle plantar flexion, inversion and knee extension  Goals of therapy: Improve ROM, AFO fit, pain from spasticity  Biological: Botox (onabotulinumtoxinA, [employer])   Dose in Units: 260  Units drawn up: 260  Units discarded: 40  Last Round: NA  Benefit: Never, this is the first round of injections by this practitioner  Adverse effects: NA  Wear off date: NA  Additional notes: requests PT order, which I think is an excellent idea for her to start about 2 weeks after injections, twice daily stretching recommended    Pain level before procedure (0-10 verbal analog pain scale): 0  Pain level after procedure (0-10 verbal analog pain scale): 0    Procedure Time Out Check List:  Prior to the start of the procedure, I personally confirmed the following:  - Site Marking Verified: Yes, as appropriate  - Patient Identity (name & date of birth): Yes  - Procedure: Yes  - Site: Yes  - Body Part: right leg    The risks of the procedure, including infection, bleeding, pain and skin changes, weakness were discussed with the patient.    The patient provided verbal consent today for procedure and previously provided electronic consent, which is uploaded to the EMR.    Procedure  If Botox was used, it was reconstituted with preservative free normal saline to a concentration of 5 units/0.1cc Botox was injected into these muscles as selected by clinical and EMG examination. If Myobloc was used, it was diluted with preservative free normal saline to a concentration of 250 units/0.1cc and volumes are listed instead of units.    Muscle    Posterior tibialis 40   Medial gastrocnemius 2X20   Lateral gastrocnemius 2x20   Soleus 4x20   Extensor hallucis longus        Rectus femoris 20   Vastus lateralis 20   Vastus medialis 20     Legend for injections:  - indicates that the EMG needle was not placed in the muscle, or that clinically the muscle was not actively involved  0 indicates that an EMG needle was placed in the muscle and it was not active  40 indicates that 40 units was injected    Was EMG used? Yes   Adverse effects at time of injection: none  Target time for next injection: 12 weeks  Was patient given the biological specific REMS sheet? No    Lot number: [id number]  Expiry date: 03/10/24

## 2021-11-30 ENCOUNTER — Encounter: Admit: 2021-11-30 | Discharge: 2021-11-30 | Payer: 59

## 2021-12-10 ENCOUNTER — Encounter: Admit: 2021-12-10 | Discharge: 2021-12-10 | Payer: 59

## 2021-12-12 ENCOUNTER — Encounter: Admit: 2021-12-12 | Discharge: 2021-12-12 | Payer: 59

## 2021-12-29 ENCOUNTER — Encounter: Admit: 2021-12-29 | Discharge: 2021-12-29 | Payer: 59

## 2021-12-30 ENCOUNTER — Encounter: Admit: 2021-12-30 | Discharge: 2021-12-30 | Payer: 59

## 2021-12-30 DIAGNOSIS — G35 Multiple sclerosis: Secondary | ICD-10-CM

## 2021-12-30 MED ORDER — BETASERON 0.3 MG SC KIT
5 refills | Status: AC
Start: 2021-12-30 — End: ?

## 2021-12-30 NOTE — Telephone Encounter
Refill request for betaseron, last office visit 11-15-21 rx included in plan of care, labs up to date, refills sent

## 2022-02-18 ENCOUNTER — Encounter: Admit: 2022-02-18 | Discharge: 2022-02-18 | Payer: 59

## 2022-02-22 ENCOUNTER — Ambulatory Visit: Admit: 2022-02-22 | Discharge: 2022-02-22 | Payer: 59

## 2022-02-22 ENCOUNTER — Encounter: Admit: 2022-02-22 | Discharge: 2022-02-22 | Payer: 59

## 2022-02-22 DIAGNOSIS — G4700 Insomnia, unspecified: Secondary | ICD-10-CM

## 2022-02-22 DIAGNOSIS — R1319 Other dysphagia: Secondary | ICD-10-CM

## 2022-02-22 DIAGNOSIS — M26609 Unspecified temporomandibular joint disorder, unspecified side: Secondary | ICD-10-CM

## 2022-02-22 DIAGNOSIS — N319 Neuromuscular dysfunction of bladder, unspecified: Secondary | ICD-10-CM

## 2022-02-22 DIAGNOSIS — F32A Depression: Secondary | ICD-10-CM

## 2022-02-22 DIAGNOSIS — G35 Multiple sclerosis: Secondary | ICD-10-CM

## 2022-02-22 DIAGNOSIS — H269 Unspecified cataract: Secondary | ICD-10-CM

## 2022-02-22 DIAGNOSIS — G629 Polyneuropathy, unspecified: Secondary | ICD-10-CM

## 2022-02-22 DIAGNOSIS — I10 Essential (primary) hypertension: Secondary | ICD-10-CM

## 2022-02-22 DIAGNOSIS — G8111 Spastic hemiplegia affecting right dominant side: Secondary | ICD-10-CM

## 2022-02-22 MED ORDER — ONABOTULINUMTOXINA 100 UNIT IJ SOLR
320 [IU] | Freq: Once | INTRAMUSCULAR | 0 refills | Status: CP
Start: 2022-02-22 — End: ?
  Administered 2022-02-22: 22:00:00 320 [IU] via INTRAMUSCULAR

## 2022-02-22 MED ORDER — ONABOTULINUMTOXINA 100 UNIT IJ SOLR
260 [IU] | Freq: Once | INTRAMUSCULAR | 0 refills | Status: DC
Start: 2022-02-22 — End: 2022-02-22

## 2022-02-22 NOTE — Patient Instructions
Thank you for choosing to let us care for you today.  Please read through the following information that will help us continue to provide the best care possible.     Communication:  Preferred method of communication is through MyChart message if the issue cannot wait until your next scheduled follow up.   MyChart may be used for non-emergent communication.   Messages are not reviewed after hours or over the weekend/holidays/after 3PM. Staff will reply to your email within 48 business hours.   Results: If you do not hear from us within one week of a lab or imaging study being completed, please contact us to ensure that we have received the results. This is especially challenging when tests are done outside of the [facility] system, as many times results do not make it back to our office for a variety of reasons. For [facility] lab/imaging results, due to the CARES act the results automatically release to MyChart.  Dr. Thuringer will continue to send you a result note on MyChart for any labs that she orders.  With these changes you may see your results before Dr. Thuringer does.   Please allow up to 72 hours for review and response to your results.     Emergency:  If you are having acute (new/sudden onset) or severe/worsening neurologic symptoms, please call 911 or seek care in the ED.  After hours emergency: [phone], ask to speak with on-call neurologist    Helpful contact information:  [facility] scheduling: please call [phone] at your convenience to schedule your studies.  Referrals placed during the visit: if you have not heard from scheduling within one week, please call the call center at [phone] to get scheduling assistance.  Refills on medications, please first contact your pharmacy, who will fax a refill authorization request form to our office.  Weekdays only. Allow up to 2 business days for refills. Please plan ahead, as refills will not be filled after hours.  Scheduling staff may be reached at [phone] option 1 for scheduling needs.   Urgent nursing needs: Tarvaris Puglia LPN may be contacted at [phone]. Staff will return your call within 24 business hours.   Pharmacist (specialty medication questions): [phone]    For Appointments:   To allow us to better care for you and stay on time, we appreciate you arriving 15 minutes prior to your scheduled appointment time.   If you are late to your appointment, we reserve the right to ask you to reschedule or wait until next available time to be seen in fairness to other patients scheduled that day.   There are times when we are running behind in clinic. Our goal is to always be on time, however, there are time when unexpected events occur with patients, which may cause a delay. We appreciate your understanding when this occurs.      [facility]  [facility]  Phone: [phone]  Fax: [phone]

## 2022-02-22 NOTE — Procedures
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:     Description of Movements: right spastic ankle plantar flexion, inversion and knee extension  Goals of therapy: Improve ROM, AFO fit, pain from spasticity  Biological: Botox (onabotulinumtoxinA, [employer])   Dose in Units: 320  Units drawn up: 320  Units discarded: 80  Last Round: 11/29/21  Benefit: Good  Adverse effects: none  Wear off date: recently for lower leg, about 6 weeks ago quadriceps  Additional notes: increase knee extension dose, also add toe flexors based on morning observations of those toes having spasms     Pain level before procedure (0-10 verbal analog pain scale): 0  Pain level after procedure (0-10 verbal analog pain scale): 0     Procedure Time Out Check List:  Prior to the start of the procedure, I personally confirmed the following:  - Site Marking Verified: Yes, as appropriate  - Patient Identity (name & date of birth): Yes  - Procedure: Yes  - Site: Yes  - Body Part: right leg     The risks of the procedure, including infection, bleeding, pain and skin changes, weakness were discussed with the patient.     The patient provided verbal consent today for procedure and previously provided electronic consent, which is uploaded to the EMR.     Procedure  If Botox was used, it was reconstituted with preservative free normal saline to a concentration of 5 units/0.1cc Botox was injected into these muscles as selected by clinical and EMG examination. If Myobloc was used, it was diluted with preservative free normal saline to a concentration of 250 units/0.1cc and volumes are listed instead of units.     Muscle     Posterior tibialis 40   Medial gastrocnemius 2X20   Lateral gastrocnemius 2x20   Soleus 4x20   Flexor digitorum longus  20         Rectus femoris 3x20   Vastus lateralis 20   Vastus medialis 20      Legend for injections:  - indicates that the EMG needle was not placed in the muscle, or that clinically the muscle was not actively involved  0 indicates that an EMG needle was placed in the muscle and it was not active  40 indicates that 40 units was injected     Was EMG used? Yes   Adverse effects at time of injection: none  Target time for next injection: 12 weeks  Was patient given the biological specific REMS sheet? No     Lot number: [id number]  Expiry date: 03/10/24

## 2022-03-08 ENCOUNTER — Encounter: Admit: 2022-03-08 | Discharge: 2022-03-08 | Payer: 59

## 2022-03-29 ENCOUNTER — Encounter: Admit: 2022-03-29 | Discharge: 2022-03-29 | Payer: 59

## 2022-03-29 NOTE — Telephone Encounter
Spoke with Kind[place] with [facility] requesting to cloud imaging to [facility]. Requested report be faxed to [phone].     Entered a [facility] Image Request # [id number] on 03/29/22.

## 2022-03-31 ENCOUNTER — Encounter: Admit: 2022-03-31 | Discharge: 2022-03-31 | Payer: 59

## 2022-04-01 ENCOUNTER — Encounter: Admit: 2022-04-01 | Discharge: 2022-04-01 | Payer: 59

## 2022-04-14 ENCOUNTER — Ambulatory Visit: Admit: 2022-04-14 | Discharge: 2022-04-14 | Payer: 59

## 2022-04-14 ENCOUNTER — Encounter: Admit: 2022-04-14 | Discharge: 2022-04-14 | Payer: 59

## 2022-04-15 ENCOUNTER — Encounter: Admit: 2022-04-15 | Discharge: 2022-04-15 | Payer: 59

## 2022-04-18 ENCOUNTER — Encounter: Admit: 2022-04-18 | Discharge: 2022-04-18 | Payer: 59

## 2022-04-18 NOTE — Progress Notes
AUTH NEEDED FOR EXTERNAL IMAGING    Patient info:  Name: Leslie Hawkins    MRN: [id number]          DOB: 1960-10-22      Insurance:   Payor: Monia Pouch / Plan: [employer] CHOICE POS II / Product Type: *No Product type* /      Order sent to Facility :  04/18/2022    What Facility was the order sent to (if multiple locations please specify address):   [facility]  [address]   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

## 2022-05-12 ENCOUNTER — Encounter: Admit: 2022-05-12 | Discharge: 2022-05-12 | Payer: 59

## 2022-05-30 ENCOUNTER — Encounter: Admit: 2022-05-30 | Discharge: 2022-05-30 | Payer: 59

## 2022-05-30 DIAGNOSIS — Z79899 Other long term (current) drug therapy: Secondary | ICD-10-CM

## 2022-05-30 DIAGNOSIS — G35 Multiple sclerosis: Secondary | ICD-10-CM

## 2022-06-01 ENCOUNTER — Encounter: Admit: 2022-06-01 | Discharge: 2022-06-01 | Payer: 59

## 2022-06-07 MED ADMIN — ONABOTULINUMTOXINA 100 UNIT IJ SOLR [77983]: 400 [IU] | INTRAMUSCULAR | @ 17:00:00 | Stop: 2022-06-07 | NDC 00023114501

## 2022-06-08 ENCOUNTER — Encounter: Admit: 2022-06-08 | Discharge: 2022-06-08 | Payer: 59

## 2022-06-08 DIAGNOSIS — G629 Polyneuropathy, unspecified: Secondary | ICD-10-CM

## 2022-06-08 DIAGNOSIS — M26609 Unspecified temporomandibular joint disorder, unspecified side: Secondary | ICD-10-CM

## 2022-06-08 DIAGNOSIS — H269 Unspecified cataract: Secondary | ICD-10-CM

## 2022-06-08 DIAGNOSIS — G4700 Insomnia, unspecified: Secondary | ICD-10-CM

## 2022-06-08 DIAGNOSIS — G35 Multiple sclerosis: Secondary | ICD-10-CM

## 2022-06-08 DIAGNOSIS — R1319 Other dysphagia: Secondary | ICD-10-CM

## 2022-06-08 DIAGNOSIS — N319 Neuromuscular dysfunction of bladder, unspecified: Secondary | ICD-10-CM

## 2022-06-08 DIAGNOSIS — I10 Essential (primary) hypertension: Secondary | ICD-10-CM

## 2022-06-08 DIAGNOSIS — F32A Depression: Secondary | ICD-10-CM

## 2022-06-08 MED ORDER — BETASERON 0.3 MG SC KIT
5 refills | Status: AC
Start: 2022-06-08 — End: ?

## 2022-06-10 ENCOUNTER — Encounter: Admit: 2022-06-10 | Discharge: 2022-06-10 | Payer: 59

## 2022-06-10 MED ORDER — GABAPENTIN 300 MG PO CAP
300 mg | ORAL_CAPSULE | Freq: Three times a day (TID) | ORAL | 1 refills | Status: AC
Start: 2022-06-10 — End: ?

## 2022-06-10 MED ORDER — FLUOXETINE 20 MG PO CAP
20 mg | ORAL_CAPSULE | Freq: Every day | ORAL | 1 refills | Status: AC
Start: 2022-06-10 — End: ?

## 2022-06-10 MED ORDER — BACLOFEN 10 MG PO TAB
10 mg | ORAL_TABLET | Freq: Three times a day (TID) | ORAL | 1 refills | Status: AC
Start: 2022-06-10 — End: ?

## 2022-06-10 MED ORDER — OXYBUTYNIN CHLORIDE 5 MG PO TAB
5 mg | ORAL_TABLET | Freq: Three times a day (TID) | ORAL | 1 refills | 12.00000 days | Status: AC
Start: 2022-06-10 — End: ?

## 2022-06-10 NOTE — Telephone Encounter
Refill request for baclofen, fluoxetine, gabapentin,oxybutynin last office visit 06-07-22 refills sent

## 2022-06-16 ENCOUNTER — Encounter: Admit: 2022-06-16 | Discharge: 2022-06-16 | Payer: 59

## 2022-06-16 DIAGNOSIS — Z79899 Other long term (current) drug therapy: Secondary | ICD-10-CM

## 2022-06-16 DIAGNOSIS — G35 Multiple sclerosis: Secondary | ICD-10-CM

## 2022-07-07 ENCOUNTER — Encounter: Admit: 2022-07-07 | Discharge: 2022-07-07 | Payer: 59

## 2022-07-11 ENCOUNTER — Encounter: Admit: 2022-07-11 | Discharge: 2022-07-11 | Payer: 59

## 2022-10-26 ENCOUNTER — Encounter: Admit: 2022-10-26 | Discharge: 2022-10-26 | Payer: 59

## 2022-10-26 DIAGNOSIS — Z79899 Other long term (current) drug therapy: Secondary | ICD-10-CM

## 2022-10-26 DIAGNOSIS — G35 Multiple sclerosis: Secondary | ICD-10-CM

## 2022-11-02 ENCOUNTER — Ambulatory Visit: Admit: 2022-11-02 | Discharge: 2022-11-02 | Payer: 59

## 2022-11-02 ENCOUNTER — Encounter: Admit: 2022-11-02 | Discharge: 2022-11-02 | Payer: 59

## 2022-11-02 DIAGNOSIS — F32A Depression: Secondary | ICD-10-CM

## 2022-11-02 DIAGNOSIS — R1319 Other dysphagia: Secondary | ICD-10-CM

## 2022-11-02 DIAGNOSIS — G629 Polyneuropathy, unspecified: Secondary | ICD-10-CM

## 2022-11-02 DIAGNOSIS — G35 Multiple sclerosis: Secondary | ICD-10-CM

## 2022-11-02 DIAGNOSIS — I10 Essential (primary) hypertension: Secondary | ICD-10-CM

## 2022-11-02 DIAGNOSIS — M26609 Unspecified temporomandibular joint disorder, unspecified side: Secondary | ICD-10-CM

## 2022-11-02 DIAGNOSIS — H269 Unspecified cataract: Secondary | ICD-10-CM

## 2022-11-02 DIAGNOSIS — G4700 Insomnia, unspecified: Secondary | ICD-10-CM

## 2022-11-02 DIAGNOSIS — N319 Neuromuscular dysfunction of bladder, unspecified: Secondary | ICD-10-CM

## 2022-11-02 MED ORDER — FLUOXETINE 40 MG PO CAP
40 mg | ORAL_CAPSULE | Freq: Every day | ORAL | 5 refills | Status: AC
Start: 2022-11-02 — End: ?

## 2022-11-02 NOTE — Assessment & Plan Note
Increase fluoxetine to 40 mg daily  Get back to stretching exercises.

## 2022-11-03 ENCOUNTER — Encounter: Admit: 2022-11-03 | Discharge: 2022-11-03 | Payer: 59

## 2022-11-03 DIAGNOSIS — R1319 Other dysphagia: Secondary | ICD-10-CM

## 2022-11-03 DIAGNOSIS — G4700 Insomnia, unspecified: Secondary | ICD-10-CM

## 2022-11-03 DIAGNOSIS — M26609 Unspecified temporomandibular joint disorder, unspecified side: Secondary | ICD-10-CM

## 2022-11-03 DIAGNOSIS — N319 Neuromuscular dysfunction of bladder, unspecified: Secondary | ICD-10-CM

## 2022-11-03 DIAGNOSIS — H269 Unspecified cataract: Secondary | ICD-10-CM

## 2022-11-03 DIAGNOSIS — G35 Multiple sclerosis: Secondary | ICD-10-CM

## 2022-11-03 DIAGNOSIS — I10 Essential (primary) hypertension: Secondary | ICD-10-CM

## 2022-11-03 DIAGNOSIS — G629 Polyneuropathy, unspecified: Secondary | ICD-10-CM

## 2022-11-03 DIAGNOSIS — F32A Depression: Secondary | ICD-10-CM

## 2022-12-05 ENCOUNTER — Encounter: Admit: 2022-12-05 | Discharge: 2022-12-05 | Payer: 59

## 2022-12-05 MED ORDER — OXYBUTYNIN CHLORIDE 5 MG PO TAB
5 mg | ORAL_TABLET | Freq: Three times a day (TID) | ORAL | 1 refills | 12.00000 days | Status: AC
Start: 2022-12-05 — End: ?

## 2022-12-05 MED ORDER — BACLOFEN 10 MG PO TAB
10 mg | ORAL_TABLET | Freq: Three times a day (TID) | ORAL | 1 refills | Status: AC
Start: 2022-12-05 — End: ?

## 2022-12-05 MED ORDER — FLUOXETINE 40 MG PO CAP
40 mg | ORAL_CAPSULE | Freq: Every day | ORAL | 1 refills | Status: AC
Start: 2022-12-05 — End: ?

## 2022-12-05 MED ORDER — GABAPENTIN 300 MG PO CAP
300 mg | ORAL_CAPSULE | Freq: Three times a day (TID) | ORAL | 1 refills | Status: AC
Start: 2022-12-05 — End: ?

## 2022-12-22 ENCOUNTER — Encounter: Admit: 2022-12-22 | Discharge: 2022-12-22 | Payer: 59

## 2022-12-22 DIAGNOSIS — G35 Multiple sclerosis: Secondary | ICD-10-CM

## 2022-12-22 MED ORDER — BETASERON 0.3 MG SC KIT
5 refills | Status: AC
Start: 2022-12-22 — End: ?

## 2022-12-22 NOTE — Telephone Encounter
Refill request for betaseron, last office visit 11-02-22 rx included in plan of care, labs up to date, refills sent

## 2023-02-03 ENCOUNTER — Encounter: Admit: 2023-02-03 | Discharge: 2023-02-03 | Payer: PRIVATE HEALTH INSURANCE

## 2023-02-16 ENCOUNTER — Encounter: Admit: 2023-02-16 | Discharge: 2023-02-16 | Payer: PRIVATE HEALTH INSURANCE

## 2023-03-06 ENCOUNTER — Encounter: Admit: 2023-03-06 | Discharge: 2023-03-06

## 2023-03-06 ENCOUNTER — Encounter: Admit: 2023-03-06 | Discharge: 2023-03-06 | Payer: PRIVATE HEALTH INSURANCE

## 2023-03-06 DIAGNOSIS — G35 Multiple sclerosis: Secondary | ICD-10-CM

## 2023-03-06 MED ORDER — BETASERON 0.3 MG SC KIT
0 refills | Status: AC
Start: 2023-03-06 — End: ?

## 2023-03-07 ENCOUNTER — Encounter: Admit: 2023-03-07 | Discharge: 2023-03-07

## 2023-03-07 ENCOUNTER — Encounter: Admit: 2023-03-07 | Discharge: 2023-03-07 | Payer: PRIVATE HEALTH INSURANCE

## 2023-03-07 NOTE — Progress Notes
 Pharmacy Medication Reassessment and Education    Indication/Regimen  The regimen of BETASERON 0.3 MG SC KIT indefinitely is appropriate for Leslie Hawkins who has MS (multiple sclerosis) (HCC).    Renal dose adjustments are not required. Hepatic dose adjustments are not required. Dose titration is not required. Leslie Hawkins started taking the medication on 05/11/1994.    The patient has the ability to self-administer the medication(s).    Baseline Characteristics  Previous medications for this indication: copaxone, Avonex  Current medications for this indication: none  Allergy or intolerance to medications for this indication: none    Screening and Monitoring  Baseline labs were completed and evaluated.  The patient did report signs/symptoms of an infection. Because of this the following steps will be taken: contact provider. Patient is being treated for staph infection  The patient has orders for lab monitoring: yes. Labs are within range.    Therapeutic Goals and Monitoring  The goal of therapy is to stabilize or decrease rate of relapse.    Number of relapses in the past 12 months: 0  Evaluation of relapse rate: stable  Symptom assessment: stable symptoms  No new MS symptoms    The patient is making progress toward achieving their therapeutic goal. No new MS symptoms The plan is to continue current therapy. continue medication while effective and affordable    Past Medical History and Comorbidities  Patient Active Problem List   Diagnosis    MS (multiple sclerosis) (HCC)    Cataract    Depression    Insomnia    Neurogenic urinary bladder disorder    Neurogenic bowel    Meningioma (HCC)    Spasticity     Additional comorbidities: no    Labs and Diagnostic Tests  Lab Results   Component Value Date/Time    WBC 3.6 10/24/2022 12:00 AM    RBC 4.01 (L) 10/24/2022 12:00 AM    HGB 12.4 10/24/2022 12:00 AM    HCT 37.5 (L) 10/24/2022 12:00 AM    MCV 93.5 10/24/2022 12:00 AM    MCH 30.9 10/24/2022 12:00 AM    MCHC 33.1 10/24/2022 12:00 AM    RDW 12.4 10/24/2022 12:00 AM    PLTCT 259 10/24/2022 12:00 AM    MPV 9.8 10/24/2022 12:00 AM       Lab Results   Component Value Date/Time    NEUT 58.3 10/24/2022 12:00 AM    ANC 2.1 10/24/2022 12:00 AM    LYMA 27.8 10/24/2022 12:00 AM    ALC 1.0 10/24/2022 12:00 AM    MONA 11.6 10/24/2022 12:00 AM    AMC 0.4 10/24/2022 12:00 AM    EOSA 1.1 10/07/2014 12:38 PM    AEC 0.1 10/24/2022 12:00 AM    BASA 0.6 10/24/2022 12:00 AM    ABC 0.0 10/24/2022 12:00 AM       Lab Results   Component Value Date/Time    NA 139 10/24/2022 12:00 AM    K 4.2 10/24/2022 12:00 AM    CL 103 10/24/2022 12:00 AM    CO2 30.3 10/24/2022 12:00 AM    GAP 9.9 10/24/2022 12:00 AM    BUN 15.0 10/24/2022 12:00 AM    CR 0.8 10/24/2022 12:00 AM    GLU 104 10/24/2022 12:00 AM       Lab Results   Component Value Date/Time    CA 9.8 10/24/2022 12:00 AM    PO4 2.4 09/25/2017 04:54 AM    ALBUMIN 4.0 10/24/2022 12:00 AM  TOTPROT 7.6 10/24/2022 12:00 AM    ALKPHOS 89 10/24/2022 12:00 AM    AST 21 10/24/2022 12:00 AM    ALT 29 10/24/2022 12:00 AM    TOTBILI 0.4 10/24/2022 12:00 AM    GFR >60 10/24/2022 12:00 AM    GFRAA 60 03/26/2018 12:00 AM       No results found for: TSH, FREET3, FREET4R    Allergies  Allergies   Allergen Reactions    Morphine HIVES    Ampyra [Dalfampridine] SEE COMMENTS     Erythema multiforme    Codeine NAUSEA AND VOMITING        Immunizations  Vaccine history was reviewed with the patient. Education was provided on the importance of completing vaccines, including annual influenza vaccine and COVID-19 vaccine.    Immunization History   Administered Date(s) Administered    COVID-19 (MODERNA), mRNA vacc, 100 mcg/0.5 mL (PF) 04/01/2019, 04/29/2019, 11/09/2019    Covid-19 Bivalent (26yr+)(MODERNA), mRNA Vacc, 50 mcg/0.5 mL (PF) 11/09/2020       Medication Reconciliation  Medication history and reconciliation were performed (including prescription medications, supplements, over the counter, and herbal products). The medication list was updated and the patient's current medication list is included. The patient was instructed to speak with their health care provider before starting any new drug, including prescription or over the counter, natural / herbal products, or vitamins.    Drug Interactions    Drug-Drug Interactions  Drug-drug interactions were evaluated. There were not clinically significant drug-drug interactions.     Drug-Food Interactions  Drug-food interactions were not evaluated (NA - not oral).    Home Medications    Medication Sig   acetaminophen (TYLENOL EXTRA STRENGTH) 500 mg tablet Take one tablet by mouth every 8 hours as needed for Pain. Max of 4,000 mg of acetaminophen in 24 hours.   amLODIPine (NORVASC) 10 mg tablet Take one-half tablet by mouth daily.   ascorbic acid (VITAMIN C) 500 mg tablet Take one tablet by mouth twice daily.   baclofen (LIORESAL) 10 mg tablet TAKE 1 TABLET BY MOUTH THREE TIMES DAILY   brimonidine (ALPHAGAN P) 0.15 % ophthalmic solution Apply one drop to both eyes twice daily.   calcium carbonate (TUMS) 500 mg (200 mg elemental calcium) chewable tablet Chew two tablets by mouth daily. Extra Strength.   docusate (COLACE) 100 mg capsule Take one capsule by mouth daily.   FLUoxetine (PROZAC) 40 mg capsule Take one capsule by mouth daily.   gabapentin (NEURONTIN) 300 mg capsule TAKE 1 CAPSULE BY MOUTH THREE TIMES DAILY   interferon beta-1b (BETASERON) 0.3 mg/1.2 mL injection KIT Inject 1 mL under the skin every 48 hours.   lisinopriL (ZESTRIL) 20 mg tablet Take one tablet by mouth daily.   Miscellaneous Medical Supply misc Provide pt with padding adjustment with new velcro for R AFO brace.   DX: Multiple Sclerosis, G35   Miscellaneous Medical Supply misc Straight in/out catheters 14 French 8-10 times per day  Medical Necessity   Multiple Sclerosis   G35   mupirocin (BACTROBAN) 2 % topical ointment Apply  topically to affected area twice daily.   oxyBUTYnin chloride (DITROPAN) 5 mg tablet TAKE 1 TABLET BY MOUTH THREE TIMES DAILY   sulfamethoxazole 800 mg-trimethoprim 160 mg tablet (BACTRIM DS) Take one tablet by mouth twice daily.     Adverse Drug Reactions  Adverse drug reactions were reviewed with the patient.    Significant adverse drug reaction(s) were not identified.    The following side effects  were reported: Patient has been injecting for 30 years and has developed redness and scar tissue at many injection sites.     The following strategies were discussed with the patient to mitigate the side effect(s): administration strategies  Patient uses Betaconnect, rotates injection sites, uses all sites available, and the most shallow setting. The patient is thin and has trouble finding subcutaneous tissue.    Discussed the following potential side effects:    The patient does not have injection related concerns.    The patient does not have infection related concerns.    Adherence  Refill and adherence history were reviewed with the patient. The patient was educated on the importance of adherence.    Patient is adherent with refills: yes  Patient is meeting refill adherence goal: yes    Patient reported 0 missed doses over the past 30 days.  Significance of missed doses: NA - no missed doses   Patient is meeting reported adherence goal.    Safety Precautions    Risk Evaluation and Mitigation (REMS) Assessment: REMS is not required for this medication.    Safety precautions were addressed and discussed with the patient as applicable.    Contraindications: Leslie Hawkins does not have contraindications to this medication.    Pregnancy Status: Female, not of child-bearing potential, education not applicable.    Medication Education  The patient was counseled via telephone. 10 minutes were spent educating the patient.    Leslie Hawkins was provided with education on their specialty medication(s). Discussion with the patient included: the medication name (brand and generic), medication class, dosing, frequency, duration, route, proper administration, monitoring, common side effects, contraindications, safety precautions, and food/drug interactions to be aware of. The indication, expectations and possible outcomes from treatment were also discussed.     Appropriate storage, safe handling, and disposal directions were reviewed. The patient was educated on timely administration of therapy and management of missed doses. Adherence with therapy and the patient's ability to be adherent with drug therapies were discussed and the patient was provided options for tools/resources that promote adherence to therapy as needed. The patient's ability to self-administer the medication was assessed. Requirements of the REMS program were discussed with the patient as applicable. Recommended vaccinations were reviewed and discussed with the patient as applicable. The patient was instructed to seek medical attention immediately if they experience signs of an allergic reaction, including but not limited to: a rash; hives; itching; red, swollen, blistered, or peeling skin with or without fever.     Patient has been on the medication since 1996 and did not have further questions.     Leslie Hawkins was given the opportunity to ask questions but did not have any questions at this time. Patient was reminded of the refill process and encouraged to call with questions. The monitoring and follow-up plan was discussed with the patient. The patient was instructed to contact their health care provider if their symptoms or health problems do not get better or if they become worse. For clinical questions about this medication, the pharmacist can be reached at [phone]. For questions about cost, insurance coverage, or to obtain refills, the patient should contact the pharmacy via MyChart or by calling [phone]. The patient verbalized acceptance and understanding.    Follow-up Plan  The patient will be reassessed within 1 year.    The filling pharmacy is unknown at this time and will be determined at a later date.    Elenor Quinones, PHARMD

## 2023-03-09 ENCOUNTER — Encounter: Admit: 2023-03-09 | Discharge: 2023-03-09 | Payer: PRIVATE HEALTH INSURANCE

## 2023-03-09 ENCOUNTER — Encounter: Admit: 2023-03-09 | Discharge: 2023-03-09 | Payer: 59

## 2023-03-09 NOTE — Progress Notes
 Pharmacy Benefits Investigation    Medication name: interferon beta-1b (BETASERON) 0.3 mg/1.2 mL injection KIT  Medication status: continuation (refill)    The insurance requires a prior authorization for the medication. The prior authorization renewal was submitted via CoverMyMeds.    PA number: [id number]    Leslie Hawkins  Specialty Pharmacy Patient Advocate

## 2023-03-16 ENCOUNTER — Encounter: Admit: 2023-03-16 | Discharge: 2023-03-16

## 2023-03-16 DIAGNOSIS — G35 Multiple sclerosis: Secondary | ICD-10-CM

## 2023-03-16 MED ORDER — BETASERON 0.3 MG SC KIT
.25 mg | SUBCUTANEOUS | 0 refills | Status: AC
Start: 2023-03-16 — End: ?

## 2023-03-16 NOTE — Telephone Encounter
 PA approved and mandated to [facility] - sent

## 2023-03-16 NOTE — Progress Notes
 Pharmacy Benefits Investigation    Medication name: interferon beta-1b (BETASERON) 0.3 mg/1.2 mL injection KIT  Medication status: continuation (refill)    The prior authorization renewal was reapproved for Leslie Hawkins (PA number [phone]) from 03/09/2023 through 03/08/2024.    The out of pocket cost is unknown because the medication cannot be filled at [facility]. The patient's insurance mandates they fill the medication at [facility] - [place], [place] - [address]    Notified the clinic for next steps.    Shelva Majestic  Specialty Pharmacy Patient Advocate

## 2023-03-18 ENCOUNTER — Encounter: Admit: 2023-03-18 | Discharge: 2023-03-18

## 2023-03-21 ENCOUNTER — Encounter: Admit: 2023-03-21 | Discharge: 2023-03-21

## 2023-03-21 NOTE — Progress Notes
 AUTH NEEDED FOR EXTERNAL IMAGING    Patient info:  Name: Leslie Hawkins    MRN: [id number]          DOB: Feb 10, 1960      Insurance:   Payor: Monia Pouch / Plan: AETNA CHOICE POS II / Product Type: *No Product type* /        CNC Sent Order to Rendering Facility:  Yes      What Facility was the order sent to (if multiple locations please specify address):   [facility] in [place]      Type of scan ordered:  MRI Head WO    Diagnosis ICD10 Code:  D32.9    Ordering Provider:  Jolayne Haines    Scan needed by date, if unknown please put approximate date:  03/22/2023

## 2023-03-21 NOTE — Telephone Encounter
 Janae with [facility] in [place] [phone] ext [phone] notified of approval for MRI Head wo and provided info: 03/21/23 per [employer] [phone]) MRI head wo contrast (08657) is approved from 03/21/23 to 09/17/23 case# [id number]  auth# [id number]

## 2023-03-30 ENCOUNTER — Encounter: Admit: 2023-03-30 | Discharge: 2023-03-30 | Payer: PRIVATE HEALTH INSURANCE

## 2023-03-31 ENCOUNTER — Encounter: Admit: 2023-03-31 | Discharge: 2023-03-31 | Payer: PRIVATE HEALTH INSURANCE

## 2023-03-31 NOTE — Telephone Encounter
 Called [facility] to have them fax report and cloud imaging from MRI Head on 3/20.  Request sent to MyIT to have images merged into pt chart once received.

## 2023-04-10 ENCOUNTER — Encounter: Admit: 2023-04-10 | Discharge: 2023-04-10

## 2023-04-10 ENCOUNTER — Ambulatory Visit: Admit: 2023-04-10 | Discharge: 2023-04-10

## 2023-05-12 ENCOUNTER — Encounter: Admit: 2023-05-12 | Discharge: 2023-05-12 | Payer: PRIVATE HEALTH INSURANCE

## 2023-05-15 ENCOUNTER — Encounter: Admit: 2023-05-15 | Discharge: 2023-05-15 | Payer: PRIVATE HEALTH INSURANCE

## 2023-05-15 ENCOUNTER — Ambulatory Visit: Admit: 2023-05-15 | Discharge: 2023-05-16 | Payer: PRIVATE HEALTH INSURANCE

## 2023-05-17 ENCOUNTER — Encounter: Admit: 2023-05-17 | Discharge: 2023-05-17 | Payer: PRIVATE HEALTH INSURANCE

## 2023-05-22 ENCOUNTER — Encounter: Admit: 2023-05-22 | Discharge: 2023-05-22 | Payer: PRIVATE HEALTH INSURANCE

## 2023-06-05 ENCOUNTER — Encounter: Admit: 2023-06-05 | Discharge: 2023-06-05 | Payer: PRIVATE HEALTH INSURANCE

## 2023-06-09 ENCOUNTER — Encounter: Admit: 2023-06-09 | Discharge: 2023-06-09 | Payer: PRIVATE HEALTH INSURANCE

## 2023-06-09 DIAGNOSIS — M25571 Pain in right ankle and joints of right foot: Secondary | ICD-10-CM

## 2023-06-09 DIAGNOSIS — M79671 Pain in right foot: Secondary | ICD-10-CM

## 2023-06-10 ENCOUNTER — Encounter: Admit: 2023-06-10 | Discharge: 2023-06-10 | Payer: PRIVATE HEALTH INSURANCE

## 2023-06-15 ENCOUNTER — Ambulatory Visit: Admit: 2023-06-15 | Discharge: 2023-06-15 | Payer: PRIVATE HEALTH INSURANCE

## 2023-06-15 ENCOUNTER — Encounter: Admit: 2023-06-15 | Discharge: 2023-06-15 | Payer: PRIVATE HEALTH INSURANCE

## 2023-06-15 DIAGNOSIS — M21541 Acquired clubfoot, right foot: Secondary | ICD-10-CM

## 2023-06-15 NOTE — Progress Notes
 Date of Service: 06/15/2023        History of Present Illness   Leslie Hawkins is a 63 year old female with a past medical history of multiple sclerosis who was referred to the clinic by Dr. Anson Kinnier for evaluation of right lower extremity contracture.  Patient ambulates at home with a walker and has been wearing a carbon fiber AFO brace.  She reports the brace is increasingly not fitting and her foot has been sliding out of it.  Her contracture has been severely affecting her ability to ambulate even with an assistive device.  She does not drive.  She does have a history of a hip fracture on the right side status post hemiarthroplasty.  She has been having frequent falls due to difficulties ambulating stemming from a contracture in her right foot and ankle.               Past Medical History:    Cataract    Depression    Hypertension    Insomnia    Neurogenic urinary bladder disorder    Neuropathy    Other dysphagia    Secondary progressive multiple sclerosis (CMS-HCC)    TMJ (temporomandibular joint disorder)    Unspecified infectious and parasitic diseases       Surgical History:   Procedure Laterality Date    HYSTERECTOMY  2007    CATARACT REMOVAL  2011    EXPLORATORY LAPAROTOMY WITHOUT BIOPSY, ILEALCOLECTOMY, WOUND VAC PLACEMENT N/A 09/17/2017    Performed by Quitman Bucy, MD at [facility] OR    REOPENING RECENT LAPAROTOMY, ILEOCOLONIC ANASTAMOSIS, ABDOMINAL WASHOUT, COLON RESECTION, FASCIAL CLOSURE N/A 09/19/2017    Performed by Robet Chiquito., MD at [facility] OR    PARTIAL HIP ARTHROPLASTY  02/2019    HX BRAIN SURGERY  9.2.22    Radiation for meningioma    HX HEART CATHETERIZATION  5.09    Broekn Heart Syndrome    HX HIP REPLACEMENT  2.21    fell on concrete,  replaced upper & ball       Family History   Problem Relation Name Age of Onset    Neuropathy Father Bartholomew Boros     Hypertension Father Bartholomew Boros     Dementia Father Bartholomew Boros         undx, this is my belief    Hypertension Mother Eligha Grumbles     Hypertension Sister Gatha Kaska     Multiple sclerosis Neg Hx      Arthritis Father Bartholomew Boros         Dx at 76        reports that she has never smoked. She has never used smokeless tobacco. She reports that she does not currently use alcohol. She reports that she does not use drugs.    Allergies   Allergen Reactions    Morphine HIVES    Ampyra [Dalfampridine] SEE COMMENTS     Erythema multiforme    Codeine NAUSEA AND VOMITING          Objective:          acetaminophen (TYLENOL EXTRA STRENGTH) 500 mg tablet Take one tablet by mouth every 8 hours as needed for Pain. Max of 4,000 mg of acetaminophen in 24 hours.    amLODIPine (NORVASC) 10 mg tablet Take one-half tablet by mouth daily.    ascorbic acid (VITAMIN C) 500 mg tablet Take one tablet by mouth twice daily.    baclofen (LIORESAL) 10 mg tablet TAKE 1 TABLET  BY MOUTH THREE TIMES DAILY    brimonidine (ALPHAGAN P) 0.15 % ophthalmic solution Apply one drop to both eyes twice daily.    calcium carbonate (TUMS) 500 mg (200 mg elemental calcium) chewable tablet Chew two tablets by mouth daily. Extra Strength.    docusate (COLACE) 100 mg capsule Take one capsule by mouth daily.    FLUoxetine (PROZAC) 40 mg capsule Take 1 capsule by mouth once daily    gabapentin (NEURONTIN) 300 mg capsule TAKE 1 CAPSULE BY MOUTH THREE TIMES DAILY    interferon beta-1b (BETASERON) 0.3 mg/1.2 mL injection KIT Inject 1 mL under the skin every 48 hours.    lisinopriL (ZESTRIL) 20 mg tablet Take one tablet by mouth daily.    Miscellaneous Medical Supply misc Provide pt with padding adjustment with new velcro for R AFO brace.   DX: Multiple Sclerosis, G35    Miscellaneous Medical Supply misc Straight in/out catheters 14 French 8-10 times per day  Medical Necessity   Multiple Sclerosis   G35    mupirocin (BACTROBAN) 2 % topical ointment Apply  topically to affected area twice daily. (Patient not taking: Reported on 05/15/2023)    oxyBUTYnin chloride (DITROPAN) 5 mg tablet TAKE 1 TABLET BY MOUTH THREE TIMES DAILY sulfamethoxazole 800 mg-trimethoprim 160 mg tablet (BACTRIM DS) Take one tablet by mouth twice daily. (Patient not taking: Reported on 05/15/2023)     There were no vitals filed for this visit.  There is no height or weight on file to calculate BMI.     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  There is spasticity in the right quad.  The foot is able to be passively corrected to neutral in the coronal plane.  Maximum dorsiflexion of the foot about 20 degrees shy of neutral.  Strength testing: Inversion 5 out of 5 with spasticity, eversion 2 out of 5, dorsiflexion 2 out of 5 with some voluntary control.  Silfverskiold test is negative.  Confusion test demonstrates inversion of the foot with active resisted hip flexion.  The tibiotalar joint is stable.  Extension contracture of the right knee, passive flexion to about 80 degrees.              Imaging   X-rays of the right foot and ankle reviewed which demonstrate no significant arthritic changes throughout the foot.  The tibiotalar joint is well aligned in the coronal and sagittal plane.  Forefoot abduction is noted.                  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  Plan: We discussed treatment of her condition with the goal of improving the alignment of her foot and ankle and correcting her deformity, giving her a greater likelihood of being able to brace her foot with less risk of developing any pressure sores, increasing the likelihood that she will be able to ambulate with an assistive device.  We discussed the other confounding factors including spasticity of her hip and quad, we discussed that surgery would not address any paralysis or spasticity or strength but would help to correct the deformity of her foot.  Plan will be for operative treatment via Achilles tendon lengthening, posterior tibialis tendon transfer through the interosseous membrane.  Patient was in agreement with this plan, she will schedule with our office for this outpatient surgery  at her convenience.                 45 minute total encounter time to include record review, history, examination, discussion of my findings, recommendations, and documentation.       Jinny Mounts, MD  [phone]

## 2023-06-19 ENCOUNTER — Encounter: Admit: 2023-06-19 | Discharge: 2023-06-19 | Payer: PRIVATE HEALTH INSURANCE

## 2023-06-21 ENCOUNTER — Ambulatory Visit: Admit: 2023-06-21 | Discharge: 2023-06-21 | Payer: PRIVATE HEALTH INSURANCE

## 2023-06-21 ENCOUNTER — Encounter: Admit: 2023-06-21 | Discharge: 2023-06-21 | Payer: PRIVATE HEALTH INSURANCE

## 2023-06-28 ENCOUNTER — Encounter: Admit: 2023-06-28 | Discharge: 2023-06-28 | Payer: PRIVATE HEALTH INSURANCE

## 2023-07-10 ENCOUNTER — Encounter: Admit: 2023-07-10 | Discharge: 2023-07-10 | Payer: PRIVATE HEALTH INSURANCE

## 2023-07-18 ENCOUNTER — Ambulatory Visit: Admit: 2023-07-18 | Discharge: 2023-07-18 | Payer: PRIVATE HEALTH INSURANCE

## 2023-07-19 ENCOUNTER — Encounter: Admit: 2023-07-19 | Discharge: 2023-07-19 | Payer: PRIVATE HEALTH INSURANCE

## 2023-07-19 ENCOUNTER — Ambulatory Visit: Admit: 2023-07-19 | Discharge: 2023-07-19 | Payer: PRIVATE HEALTH INSURANCE

## 2023-07-19 MED ORDER — LIDOCAINE (PF) 200 MG/10 ML (2 %) IJ SYRG
INTRAVENOUS | 0 refills | Status: DC
Start: 2023-07-19 — End: 2023-07-19

## 2023-07-19 MED ORDER — DEXAMETHASONE SODIUM PHOSPHATE 4 MG/ML IJ SOLN
INTRAVENOUS | 0 refills | Status: DC
Start: 2023-07-19 — End: 2023-07-19

## 2023-07-19 MED ORDER — ARTIFICIAL TEARS (PF) SINGLE DOSE DROPS GROUP
OPHTHALMIC | 0 refills | Status: DC
Start: 2023-07-19 — End: 2023-07-19

## 2023-07-19 MED ORDER — CEFAZOLIN 1 GRAM IJ SOLR
INTRAVENOUS | 0 refills | Status: DC
Start: 2023-07-19 — End: 2023-07-19

## 2023-07-19 MED ORDER — ROCURONIUM 10 MG/ML IV SOLN
INTRAVENOUS | 0 refills | Status: DC
Start: 2023-07-19 — End: 2023-07-19

## 2023-07-19 MED ORDER — PROPOFOL INJ 10 MG/ML IV VIAL
INTRAVENOUS | 0 refills | Status: DC
Start: 2023-07-19 — End: 2023-07-19

## 2023-07-19 MED ORDER — ONDANSETRON HCL (PF) 4 MG/2 ML IJ SOLN
INTRAVENOUS | 0 refills | Status: DC
Start: 2023-07-19 — End: 2023-07-19

## 2023-07-19 MED ORDER — MIDAZOLAM 1 MG/ML IJ SOLN
INTRAVENOUS | 0 refills | Status: DC
Start: 2023-07-19 — End: 2023-07-19

## 2023-07-19 MED ORDER — FENTANYL CITRATE (PF) 50 MCG/ML IJ SOLN
INTRAVENOUS | 0 refills | Status: DC
Start: 2023-07-19 — End: 2023-07-19

## 2023-07-19 MED ORDER — SUGAMMADEX 100 MG/ML IV SOLN
INTRAVENOUS | 0 refills | Status: DC
Start: 2023-07-19 — End: 2023-07-19

## 2023-07-19 MED ORDER — PROPOFOL 10 MG/ML IV EMUL 50 ML (INFUSION)(AM)(OR)
INTRAVENOUS | 0 refills | Status: DC
Start: 2023-07-19 — End: 2023-07-19

## 2023-07-19 MED FILL — OXYCODONE-ACETAMINOPHEN 5-325 MG PO TAB: 5-325 mg | ORAL | 5 days supply | Qty: 30 | Fill #0 | Status: CP

## 2023-07-20 ENCOUNTER — Encounter: Admit: 2023-07-20 | Discharge: 2023-07-20 | Payer: PRIVATE HEALTH INSURANCE

## 2023-07-25 ENCOUNTER — Encounter: Admit: 2023-07-25 | Discharge: 2023-07-25 | Payer: PRIVATE HEALTH INSURANCE

## 2023-07-25 ENCOUNTER — Ambulatory Visit: Admit: 2023-07-25 | Discharge: 2023-07-26 | Payer: PRIVATE HEALTH INSURANCE

## 2023-07-26 ENCOUNTER — Encounter: Admit: 2023-07-26 | Discharge: 2023-07-26 | Payer: PRIVATE HEALTH INSURANCE

## 2023-08-01 ENCOUNTER — Encounter: Admit: 2023-08-01 | Discharge: 2023-08-01 | Payer: PRIVATE HEALTH INSURANCE

## 2023-08-01 DIAGNOSIS — G35 Multiple sclerosis: Principal | ICD-10-CM

## 2023-08-01 MED ORDER — BETASERON 0.3 MG SC KIT
SUBCUTANEOUS | 2 refills | 28.00000 days | Status: AC
Start: 2023-08-01 — End: ?

## 2023-08-01 NOTE — Telephone Encounter
 Refill sent note to pharmacy patient needs labs

## 2023-08-02 ENCOUNTER — Encounter: Admit: 2023-08-02 | Discharge: 2023-08-02 | Payer: PRIVATE HEALTH INSURANCE

## 2023-08-08 ENCOUNTER — Ambulatory Visit: Admit: 2023-08-08 | Discharge: 2023-08-09 | Payer: PRIVATE HEALTH INSURANCE

## 2023-08-08 ENCOUNTER — Encounter: Admit: 2023-08-08 | Discharge: 2023-08-08 | Payer: PRIVATE HEALTH INSURANCE

## 2023-08-21 ENCOUNTER — Encounter: Admit: 2023-08-21 | Discharge: 2023-08-21 | Payer: PRIVATE HEALTH INSURANCE

## 2023-08-24 ENCOUNTER — Encounter: Admit: 2023-08-24 | Discharge: 2023-08-24 | Payer: PRIVATE HEALTH INSURANCE

## 2023-08-25 ENCOUNTER — Encounter: Admit: 2023-08-25 | Discharge: 2023-08-25 | Payer: PRIVATE HEALTH INSURANCE

## 2023-08-25 NOTE — Progress Notes
 Patient is currently admitted at [facility] for small bowel obstruction and is not able to get / have her Betaseron  she is asking if it is ok that she missed a few doses until her spouse can bring her medication on Saturday.   Per Dr. Leodis Leodis, Leslie MATSU, MD  Avry Monteleone, Omelia, LPN  Yes, that is okay for a few days.    Patient was advised

## 2023-09-04 ENCOUNTER — Encounter: Admit: 2023-09-04 | Discharge: 2023-09-04 | Payer: PRIVATE HEALTH INSURANCE

## 2023-09-05 ENCOUNTER — Encounter: Admit: 2023-09-05 | Discharge: 2023-09-05 | Payer: PRIVATE HEALTH INSURANCE

## 2023-09-12 ENCOUNTER — Encounter: Admit: 2023-09-12 | Discharge: 2023-09-12 | Payer: PRIVATE HEALTH INSURANCE

## 2023-09-17 ENCOUNTER — Encounter: Admit: 2023-09-17 | Discharge: 2023-09-17 | Payer: PRIVATE HEALTH INSURANCE

## 2023-09-18 ENCOUNTER — Encounter: Admit: 2023-09-18 | Discharge: 2023-09-18 | Payer: PRIVATE HEALTH INSURANCE

## 2023-09-25 ENCOUNTER — Encounter: Admit: 2023-09-25 | Discharge: 2023-09-25 | Payer: PRIVATE HEALTH INSURANCE

## 2023-09-25 MED ORDER — FLUOXETINE 40 MG PO CAP
40 mg | ORAL_CAPSULE | Freq: Every day | ORAL | 1 refills | 30.00000 days | Status: AC
Start: 2023-09-25 — End: ?

## 2023-09-25 MED ORDER — BACLOFEN 10 MG PO TAB
10 mg | ORAL_TABLET | Freq: Three times a day (TID) | ORAL | 1 refills | 30.00000 days | Status: AC | PRN
Start: 2023-09-25 — End: ?

## 2023-10-01 ENCOUNTER — Encounter: Admit: 2023-10-01 | Discharge: 2023-10-01 | Payer: PRIVATE HEALTH INSURANCE

## 2023-10-05 ENCOUNTER — Encounter: Admit: 2023-10-05 | Discharge: 2023-10-05 | Payer: PRIVATE HEALTH INSURANCE

## 2023-10-05 ENCOUNTER — Ambulatory Visit: Admit: 2023-10-05 | Discharge: 2023-10-06 | Payer: PRIVATE HEALTH INSURANCE

## 2023-10-12 ENCOUNTER — Encounter: Admit: 2023-10-12 | Discharge: 2023-10-12 | Payer: PRIVATE HEALTH INSURANCE

## 2023-10-16 ENCOUNTER — Encounter: Admit: 2023-10-16 | Discharge: 2023-10-16 | Payer: PRIVATE HEALTH INSURANCE

## 2023-10-26 ENCOUNTER — Encounter: Admit: 2023-10-26 | Discharge: 2023-10-26 | Payer: PRIVATE HEALTH INSURANCE

## 2023-10-26 NOTE — Progress Notes
 [facility] - Prescription standard written order signed by Dr. Leodis and faxed back to [phone]

## 2023-10-31 ENCOUNTER — Encounter: Admit: 2023-10-31 | Discharge: 2023-10-31 | Payer: PRIVATE HEALTH INSURANCE

## 2023-10-31 DIAGNOSIS — G35D MS (multiple sclerosis): Principal | ICD-10-CM

## 2023-10-31 MED ORDER — BETASERON 0.3 MG SC KIT
SUBCUTANEOUS | 2 refills | 28.00000 days | Status: AC
Start: 2023-10-31 — End: ?

## 2023-10-31 NOTE — Telephone Encounter
 Received a refill request for Betaseron  via fax from [facility].   LOV 05/15/23  NOV 11/27/23  Last CBC and CMP were done on 09/09/23.  Rx refilled. Dr. Leodis to [place].

## 2023-11-07 ENCOUNTER — Encounter: Admit: 2023-11-07 | Discharge: 2023-11-07 | Payer: PRIVATE HEALTH INSURANCE

## 2023-11-08 ENCOUNTER — Encounter: Admit: 2023-11-08 | Discharge: 2023-11-08 | Payer: PRIVATE HEALTH INSURANCE

## 2023-11-10 ENCOUNTER — Encounter: Admit: 2023-11-10 | Discharge: 2023-11-10 | Payer: PRIVATE HEALTH INSURANCE

## 2023-11-23 ENCOUNTER — Encounter: Admit: 2023-11-23 | Discharge: 2023-11-23 | Payer: PRIVATE HEALTH INSURANCE

## 2023-11-27 ENCOUNTER — Encounter: Admit: 2023-11-27 | Discharge: 2023-11-27 | Payer: PRIVATE HEALTH INSURANCE

## 2023-11-27 ENCOUNTER — Ambulatory Visit: Admit: 2023-11-27 | Discharge: 2023-11-28 | Payer: PRIVATE HEALTH INSURANCE

## 2023-11-27 VITALS — BP 164/81 | HR 59 | Temp 98.00000°F | Wt 104.0 lb

## 2023-11-27 DIAGNOSIS — R252 Cramp and spasm: Secondary | ICD-10-CM

## 2023-11-27 DIAGNOSIS — G35D Multiple sclerosis: Principal | ICD-10-CM

## 2023-11-27 DIAGNOSIS — E43 Unspecified severe protein-calorie malnutrition: Secondary | ICD-10-CM

## 2023-11-27 MED ORDER — BACLOFEN 10 MG PO TAB
10 mg | ORAL_TABLET | Freq: Four times a day (QID) | ORAL | 3 refills | 30.00000 days | Status: AC
Start: 2023-11-27 — End: ?

## 2023-11-27 NOTE — Progress Notes [1]
 Date of Service: 11/27/2023    Subjective:             Leslie Hawkins is a 63 y.o. female.    History of Present Illness  She had surgery to her right foot by Leslie Hawkins, and it has worked. 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  She is currently working on diet to get her bowels straightened up.             Objective:        Objective    acetaminophen  (TYLENOL  EXTRA STRENGTH) 500 mg tablet Take one tablet by mouth every 8 hours as needed for Pain. Max of 4,000 mg of acetaminophen  in 24 hours.    amLODIPine (NORVASC) 10 mg tablet Take one-half tablet by mouth at bedtime daily.    ascorbic acid (VITAMIN C) 500 mg tablet Take one tablet by mouth three times daily.    baclofen  (LIORESAL ) 10 mg tablet Take one tablet by mouth four times daily. Indications: muscle spasms caused by a spinal disease    brimonidine (ALPHAGAN P) 0.15 % ophthalmic solution Apply one drop to both eyes twice daily.    docusate (COLACE) 100 mg capsule Take one capsule by mouth twice daily.    FLUoxetine  (PROZAC ) 40 mg capsule Take one capsule by mouth daily.    gabapentin  (NEURONTIN ) 300 mg capsule TAKE 1 CAPSULE BY MOUTH THREE TIMES DAILY    interferon beta-1b  (BETASERON ) 0.3 mg/1.2 mL injection KIT INJECT 1 ML SUBCUTANEOUSLY EVERY 48 HOURS.    lisinopriL  (ZESTRIL ) 20 mg tablet Take one-half tablet by mouth daily.    Miscellaneous Medical Supply misc Provide pt with padding adjustment with new velcro for R AFO brace.   DX: Multiple Sclerosis, G35    Miscellaneous Medical Supply misc Straight in/out catheters 14 French 8-10 times per day  Medical Necessity   Multiple Sclerosis   G35    oxyBUTYnin  chloride (DITROPAN ) 5 mg tablet TAKE 1 TABLET BY MOUTH THREE TIMES DAILY    polyethylene glycol 3350  (MIRALAX ) 17 g packet Take one packet by mouth daily. Indications: constipation (Patient taking differently: Take one packet by mouth as Needed. Indications: constipation)     Vitals: 11/27/23 1213   BP: (!) 164/81   BP Source: Arm, Left Upper   Pulse: 59   Temp: 36.7 ?C (98 ?F)   TempSrc: Temporal   PainSc: One   Weight: 47.2 kg (104 lb)     Body mass index is 15.82 kg/m?Leslie Hawkins   Physical Exam   Depression Screening was performed on Leslie Hawkins in clinic today. Based on the score of  , no follow up action or recommendations are necessary at this time.  Examination    Mental Status Exam: Patient is alert and oriented in all four spheres. There is normal short and long term memory. Language functions are normal both for comprehension and expression.  Speech and Language: nl  HEENT: Normal.  Extremities:   Cranial Nerve Exam:   Cranial Nerve II Right Left   Visual Acuity  20/200 glasses  20/30-2 glasses   Pupil     Visual Fields     Fundoscopic     Color Vision       Cranial Nerves III-XII Right Left   III, IV, VI (EOM's) nl nl   V nl nl   VII nl nl   VIII nl nl   IX, X nl nl   XI  nl nl   XII nl nl     Nystagmus: None    Motor:    R L   R L   Neck flexors    Hip flexors 4 4   Neck extensors    Hip abductors     Shoulder Abductors 5 5  Hip extensors 4 4   Elbow flexors 5 5  Hip adductors     Elbow extensors 5 5  Knee flexors 5 5   Wrist flexors 5 5  Knee extensors 5 5   Wrist extensors 5 5  Ankle dorsiflexors 5 5   Finger flexors 5 5  Ankle plantar flexors 5 5   Finger abductors 5 5  Ankle inversion      5 5  Ankle eversion         Toe flexors         Toe extensors       Bulk and Tone:    Upper Extremity R L   Atrophy No No   Increased Tone No No   Fasciculations No No     Lower Extremity R L   Atrophy No No   Increased Tone No No   Fasciculations No No     Cerebellar/Fine Motor R L   Finger nose finger Mild dysmetria Mild dystmetria   Heel to shin     Finger tapping     Foot tapping     Rapid alternating movements Normal Normal     Gait: Spastic, bilateral,  right leg a little worse than left . With walker and AFO  Ambulation Index: 21.56 walker  Romberg:   9 Hole Peg:   RH 29.50   LH 32.75  SDMT: 44           Assessment and Plan:    Problem   Severe Malnutrition   Multiple Sclerosis    Multiple Sclerosis Subtype: Secondary progressive   Symptom onset: 1992  Date of Diagnosis: 1992  Prior Medication failures: Avonex, Copaxone, Imuran, NBI 5788  Current DMD: Betaseron    Last MRI 03/2022  1.  Numerous FLAIR hyperintense supratentorial white matter lesions and periatrial predominant cerebral volume loss, consistent with clinically reported multiple sclerosis. These are unchanged from previous scan   2.  Moderate diffuse cerebellar atrophy.   3.  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 This is unchanged from 2022. There does appear to be a mild increased T2 signal intensity in the left upper medullar near the olive.   No enhancing lesions outside of the meningioma          Severe malnutrition  She has lost weight from her last visit with me, and I was worried about her then. She needs a dietary consult to discuss a weight gain plan.  We discussed a daily calorie goal to try to help her gain weight.     Multiple sclerosis  She is clinically stable, although she still has a lot of spasticity.   The surgery of her foot was helpful.   Will Continue betaseron   Continue gabapentin   Continue oxybutynin   Increase baclofen  to 10 mg QID to help a little more with spasticity.   CBC, CMP done in October are okay.

## 2023-12-09 ENCOUNTER — Encounter: Admit: 2023-12-09 | Discharge: 2023-12-09 | Payer: PRIVATE HEALTH INSURANCE

## 2023-12-14 ENCOUNTER — Encounter: Admit: 2023-12-14 | Discharge: 2023-12-14 | Payer: PRIVATE HEALTH INSURANCE

## 2024-01-10 ENCOUNTER — Encounter: Admit: 2024-01-10 | Discharge: 2024-01-10 | Payer: PRIVATE HEALTH INSURANCE

## 2024-02-03 ENCOUNTER — Encounter: Admit: 2024-02-03 | Discharge: 2024-02-03 | Payer: PRIVATE HEALTH INSURANCE

## 2024-02-05 ENCOUNTER — Encounter: Admit: 2024-02-05 | Discharge: 2024-02-05 | Payer: PRIVATE HEALTH INSURANCE

## 2024-02-05 NOTE — Progress Notes [1]
 Specialty Medication Reassessment Attempt      Medication name: BETASERON  SC  Reason: follow-up    Contacted Leslie Hawkins to verify compliance and assess tolerance of their specialty medication.    Sent Leslie Hawkins a MyChart message. Patient can reply via MyChart or call the pharmacist at [phone]. Will follow up in 5 business day(s) if patient has not returned call.      Larraine Blumenthal, PHARMD

## 2024-02-07 ENCOUNTER — Encounter: Admit: 2024-02-07 | Discharge: 2024-02-07 | Payer: PRIVATE HEALTH INSURANCE

## 2024-02-07 MED ORDER — FLUOXETINE 40 MG PO CAP
40 mg | ORAL_CAPSULE | Freq: Every day | ORAL | 1 refills | 30.00000 days | Status: AC
Start: 2024-02-07 — End: ?

## 2024-02-07 MED ORDER — OXYBUTYNIN CHLORIDE 5 MG PO TAB
5 mg | ORAL_TABLET | Freq: Three times a day (TID) | ORAL | 1 refills | 12.00000 days | Status: AC
Start: 2024-02-07 — End: ?

## 2024-02-07 MED ORDER — GABAPENTIN 300 MG PO CAP
300 mg | ORAL_CAPSULE | Freq: Three times a day (TID) | ORAL | 1 refills | 30.00000 days | Status: AC
Start: 2024-02-07 — End: ?

## 2024-02-07 MED ORDER — BACLOFEN 10 MG PO TAB
10 mg | ORAL_TABLET | Freq: Four times a day (QID) | ORAL | 3 refills | 30.00000 days | Status: AC
Start: 2024-02-07 — End: ?

## 2024-02-07 NOTE — Telephone Encounter [36]
 Refill request for fluoxetine , baclofen , gabapentin , oxybutynin , last office visit 11-27-23, pended to Dr. Leodis to approve to [facility]

## 2024-02-15 ENCOUNTER — Ambulatory Visit: Admit: 2024-02-15 | Discharge: 2024-02-16 | Payer: MEDICARE

## 2024-02-15 ENCOUNTER — Encounter: Admit: 2024-02-15 | Discharge: 2024-02-15 | Payer: MEDICARE

## 2024-02-15 NOTE — Progress Notes [1]
 Clinical Nutrition Assessment Summary - Initial Visit     Name: Leslie Hawkins         MRN: [id number]                 DOB: [birth year]          Age: 64 y.o.  Date of Service: @DATE @      Leslie Hawkins is a 64 y.o. female Referred by: Reena Quivers, MD        Reason for Nutrition Referral: Unintentional weight loss  This visit was conducted in person     Nutrition Assessment  Leslie Hawkins presented with history of weight loss after surgery for a bowel obstruction. She has recently gained 5lbs but is having difficulty navigating what foods to eat to gain weight while also managing persistent bowel issues. Current weight of 111.2lbs. Prior to surgery she reported being 120-125lbs. She reports having a low appetite and has fear of consuming something that will upset her bowels. She says that since the surgery she has to avoid dairy due to it causing diarrhea. This was not an issue prior to having a section of the small intestine removed. She states that a previous dietitian instructed her to consume almond milk. This complicates dietary interventions for malnutrition due to dairy being a rich source of energy and protein. Her MNA score was 12.5 (scoring below) indicating malnutrition. This was corroborated by observations during the NFPE of notable lean tissue hollowing in the temples, shoulder, clavicles, and lower leg. 24-hour dietary recall estimated 1500kcal and 35g protein which are below estimated needs of 1600-1800kcal and 50-75g protein. She says she has a non-dairy protein powder but has fallen out of the habit of using it.      Mini Nutrition Assessment (MNA): Total score 12.5  0 = severe decrease in food intake  3 = No weight loss over previous 3 months  1 = Able to get out of bed / chair but does not go out  2 = No psychological stress or acute disease in 3 months  1 = Moderate depression  0 = BMI < 19  1 = lives independently  0 = >3 prescription drugs per day  1 = no pressure sores or skin ulcers  0 = 3 meals per day  0 = dairy, eggs, meat are consumed regularly  0 = consumes 2+ fruit or veg per day  0.5 = consumes 5+ cups of liquid per day  2 = self feeds without difficulty  0 = uncertain of nutritional state  0 = as good of health as others of age   1 = MAC > 22 cm  0 = MCC > 31 cm     Nutrition-focused physical exam (NFPE) observations:  Hair: dull   Eyes: clear  Mouth: No red puffy gums or cracked lips  Skin:  clear  Nails:   Muscle stores: square shoulders, prominent clavicles, hollowing of templates, calf circumference of 27cm which is below the MNA cut-off for malnutrition (31cm), mid-arm circumference of 23cm which is just over the MNA cut-off for malnutrition (22cm).  Fat stores: hollowing of orbital fat pads     24-hour dietary recall: Recent Meals/Snacks  Breakfast: 2c raisin bran with 1/2c almond milk  Lunch: 2 eggs fried in butter wrapped in medium tortilla with ketchup, 2 medium homemade reeses pb cookies, 1 cup tang  Dinner: 6 chicken nuggets with BBQ sauce, 6 steak fries with ketchup  Snacks:   mid-morning: 2 servings homemade jello  w/ fruit, 2/3c cheese crackers, 2 cups almond milk-based hot cocoa/coffee drink  Afternoon: 2/3c cheese crackers  Evening: 2c rainsin bran w/ 1/2c almond milk, 2/3c cheese crackers    Avoidances: dairy due to diarrhea  Estimated 35g protein and 1500 kcal.     Estimated needs:  Protein: 1.0-1.5g protein per kg body weight per day = 50-75g/day  Calories: [place] with 1.2 activity factor and 1.25 stress factor = 1650kcal/d         Nutrition Diagnosis  Unintended weight loss (NC-3.2) r/t previous small intestine surgery AEB low MNA score (12.5), signs of muscle and fat hollowing on NFPE, reported history of weight loss, fear of eating, poor appetite, and low intake of calories and protein.     Nutrition Intervention  Discussed strategies to increase intake of calories and especially protein. Recommended switching from almond milk (1g protein per c) to soy milk (8g protein per c) to increase protein intake. This swap alone would correct the protein deficit on the 24-hour recall. Also discussed other energy and protein dense snacks as well as the use of non-dairy protein powders. Furthermore, we discussed the use of sour and tangy foods to stimulate appetite. Based on described symptom of diarrhea and statements that hard cheeses are okay but milk and ice cream are not, I suspect that the issue with dairy is due to lactose intolerance and suggested that Leslie Hawkins try a lactose-free milk after she has gotten her weight better managed.      Patient Goals  Gain 5-10 lbs over 3 months  Increase protein intake to 50-75g per day  Increase kcal intake to 1600-1800 per day.     Nutrition Monitoring and Evaluation  Follow up via telehealth (patient preference) in 3 months.    Recommend staff to regularly screen for malnutrition
